# Patient Record
Sex: Female | Born: 1980 | Race: White | Hispanic: No | Smoking: Former smoker
Health system: Southern US, Community
[De-identification: ages and names within clinical notes are randomized; demographics above are authoritative.]

## PROBLEM LIST (undated history)

## (undated) ENCOUNTER — Inpatient Hospital Stay (HOSPITAL_COMMUNITY): Payer: Self-pay

## (undated) DIAGNOSIS — F419 Anxiety disorder, unspecified: Secondary | ICD-10-CM

## (undated) DIAGNOSIS — Z8742 Personal history of other diseases of the female genital tract: Secondary | ICD-10-CM

## (undated) HISTORY — PX: NO PAST SURGERIES: SHX2092

---

## 1999-11-05 ENCOUNTER — Other Ambulatory Visit: Admission: RE | Admit: 1999-11-05 | Discharge: 1999-11-05 | Payer: Self-pay | Admitting: Gynecology

## 2000-04-26 ENCOUNTER — Inpatient Hospital Stay (HOSPITAL_COMMUNITY): Admission: AD | Admit: 2000-04-26 | Discharge: 2000-04-28 | Payer: Self-pay | Admitting: *Deleted

## 2001-09-14 ENCOUNTER — Inpatient Hospital Stay (HOSPITAL_COMMUNITY): Admission: AD | Admit: 2001-09-14 | Discharge: 2001-09-14 | Payer: Self-pay | Admitting: *Deleted

## 2001-09-14 ENCOUNTER — Encounter: Payer: Self-pay | Admitting: *Deleted

## 2001-11-07 ENCOUNTER — Other Ambulatory Visit: Admission: RE | Admit: 2001-11-07 | Discharge: 2001-11-07 | Payer: Self-pay | Admitting: Gynecology

## 2001-11-07 ENCOUNTER — Other Ambulatory Visit: Admission: RE | Admit: 2001-11-07 | Discharge: 2001-11-07 | Payer: Self-pay | Admitting: Obstetrics and Gynecology

## 2002-01-31 ENCOUNTER — Other Ambulatory Visit: Admission: RE | Admit: 2002-01-31 | Discharge: 2002-01-31 | Payer: Self-pay | Admitting: Obstetrics and Gynecology

## 2002-04-22 ENCOUNTER — Inpatient Hospital Stay (HOSPITAL_COMMUNITY): Admission: AD | Admit: 2002-04-22 | Discharge: 2002-04-22 | Payer: Self-pay | Admitting: Obstetrics & Gynecology

## 2002-04-26 ENCOUNTER — Inpatient Hospital Stay (HOSPITAL_COMMUNITY): Admission: AD | Admit: 2002-04-26 | Discharge: 2002-04-28 | Payer: Self-pay | Admitting: Obstetrics and Gynecology

## 2002-05-24 ENCOUNTER — Other Ambulatory Visit: Admission: RE | Admit: 2002-05-24 | Discharge: 2002-05-24 | Payer: Self-pay | Admitting: Obstetrics and Gynecology

## 2005-12-06 ENCOUNTER — Emergency Department (HOSPITAL_COMMUNITY): Admission: EM | Admit: 2005-12-06 | Discharge: 2005-12-06 | Payer: Self-pay | Admitting: Emergency Medicine

## 2006-04-11 ENCOUNTER — Emergency Department (HOSPITAL_COMMUNITY): Admission: EM | Admit: 2006-04-11 | Discharge: 2006-04-11 | Payer: Self-pay | Admitting: Emergency Medicine

## 2007-05-02 ENCOUNTER — Inpatient Hospital Stay (HOSPITAL_COMMUNITY): Admission: AD | Admit: 2007-05-02 | Discharge: 2007-05-02 | Payer: Self-pay | Admitting: Obstetrics & Gynecology

## 2007-06-01 ENCOUNTER — Inpatient Hospital Stay (HOSPITAL_COMMUNITY): Admission: AD | Admit: 2007-06-01 | Discharge: 2007-06-01 | Payer: Self-pay | Admitting: Obstetrics and Gynecology

## 2007-06-04 ENCOUNTER — Inpatient Hospital Stay (HOSPITAL_COMMUNITY): Admission: AD | Admit: 2007-06-04 | Discharge: 2007-06-04 | Payer: Self-pay | Admitting: Obstetrics & Gynecology

## 2007-06-22 ENCOUNTER — Inpatient Hospital Stay (HOSPITAL_COMMUNITY): Admission: AD | Admit: 2007-06-22 | Discharge: 2007-06-24 | Payer: Self-pay | Admitting: Obstetrics & Gynecology

## 2009-06-07 ENCOUNTER — Encounter: Admission: RE | Admit: 2009-06-07 | Discharge: 2009-06-07 | Payer: Self-pay | Admitting: Family Medicine

## 2010-06-03 ENCOUNTER — Emergency Department (HOSPITAL_COMMUNITY): Admission: EM | Admit: 2010-06-03 | Discharge: 2010-06-03 | Payer: Self-pay | Admitting: Family Medicine

## 2010-06-05 ENCOUNTER — Emergency Department (HOSPITAL_COMMUNITY): Admission: EM | Admit: 2010-06-05 | Discharge: 2010-06-05 | Payer: Self-pay | Admitting: Family Medicine

## 2011-05-05 LAB — CBC
HCT: 30 — ABNORMAL LOW
Hemoglobin: 10.6 — ABNORMAL LOW
Hemoglobin: 12.8
MCHC: 35.1
MCHC: 35.2
MCV: 95.4
RBC: 3.84 — ABNORMAL LOW
RDW: 12.6

## 2011-05-06 ENCOUNTER — Encounter (HOSPITAL_COMMUNITY): Payer: Self-pay

## 2011-05-06 ENCOUNTER — Inpatient Hospital Stay (HOSPITAL_COMMUNITY): Payer: Self-pay

## 2011-05-06 ENCOUNTER — Inpatient Hospital Stay (HOSPITAL_COMMUNITY)
Admission: AD | Admit: 2011-05-06 | Discharge: 2011-05-06 | Disposition: A | Discharge status: Home / Self Care | Payer: Self-pay | Source: Ambulatory Visit | Attending: Obstetrics & Gynecology | Admitting: Obstetrics & Gynecology

## 2011-05-06 DIAGNOSIS — O26899 Other specified pregnancy related conditions, unspecified trimester: Secondary | ICD-10-CM

## 2011-05-06 DIAGNOSIS — R1032 Left lower quadrant pain: Secondary | ICD-10-CM | POA: Insufficient documentation

## 2011-05-06 DIAGNOSIS — B373 Candidiasis of vulva and vagina: Secondary | ICD-10-CM

## 2011-05-06 DIAGNOSIS — N760 Acute vaginitis: Secondary | ICD-10-CM | POA: Insufficient documentation

## 2011-05-06 DIAGNOSIS — B3731 Acute candidiasis of vulva and vagina: Secondary | ICD-10-CM | POA: Insufficient documentation

## 2011-05-06 DIAGNOSIS — B9689 Other specified bacterial agents as the cause of diseases classified elsewhere: Secondary | ICD-10-CM | POA: Insufficient documentation

## 2011-05-06 DIAGNOSIS — A499 Bacterial infection, unspecified: Secondary | ICD-10-CM

## 2011-05-06 DIAGNOSIS — R109 Unspecified abdominal pain: Secondary | ICD-10-CM

## 2011-05-06 HISTORY — DX: Anxiety disorder, unspecified: F41.9

## 2011-05-06 LAB — URINALYSIS, ROUTINE W REFLEX MICROSCOPIC
Bilirubin Urine: NEGATIVE
Glucose, UA: NEGATIVE mg/dL
Hgb urine dipstick: NEGATIVE
Protein, ur: NEGATIVE mg/dL
Specific Gravity, Urine: 1.02 (ref 1.005–1.030)
Urobilinogen, UA: 1 mg/dL (ref 0.0–1.0)

## 2011-05-06 LAB — CBC
HCT: 37.5 % (ref 36.0–46.0)
Hemoglobin: 13.3 g/dL (ref 12.0–15.0)
MCV: 95.2 fL (ref 78.0–100.0)
RDW: 12.2 % (ref 11.5–15.5)
WBC: 6.7 10*3/uL (ref 4.0–10.5)

## 2011-05-06 LAB — WET PREP, GENITAL: Trich, Wet Prep: NONE SEEN

## 2011-05-06 MED ORDER — METRONIDAZOLE 500 MG PO TABS: 500.0000 mg | ORAL_TABLET | Freq: Two times a day (BID) | ORAL | Status: AC

## 2011-05-06 MED ORDER — FLUCONAZOLE 150 MG PO TABS: 150.0000 mg | ORAL_TABLET | Freq: Once | ORAL | Status: AC

## 2011-05-06 NOTE — ED Provider Notes (Signed)
History   Pt presents today c/o LLQ pain that has worsened over the past couple of days. She thinks she is early preg and is worried about an ectopic preg. She also c/o vag dc but denies vag bleeding, fever, irritation, or any other problems at this time.  Chief Complaint  Patient presents with  . Abdominal Pain   HPI  OB History    Grav Para Term Preterm Abortions TAB SAB Ect Mult Living   4 3 3  0  0 0 0 0 3      Past Medical History  Diagnosis Date  . Anxiety   . Abnormal Pap smear     repeat WNL    Past Surgical History  Procedure Date  . No past surgeries     No family history on file.  History  Substance Use Topics  . Smoking status: Never Smoker   . Smokeless tobacco: Not on file  . Alcohol Use: No    Allergies: Allergies not on file  No prescriptions prior to admission    Review of Systems  Constitutional: Negative for fever.  Cardiovascular: Negative for chest pain.  Gastrointestinal: Positive for abdominal pain. Negative for nausea, vomiting, diarrhea and constipation.  Genitourinary: Negative for dysuria, urgency, frequency and hematuria.  Neurological: Negative for dizziness and headaches.  Psychiatric/Behavioral: Negative for depression and suicidal ideas.   Physical Exam   Blood pressure 102/58, pulse 83, temperature 98.7 F (37.1 C), temperature source Oral, resp. rate 20, height 5\' 4"  (1.626 m), weight 188 lb (85.276 kg), SpO2 99.00%.  Physical Exam  Nursing note and vitals reviewed. Constitutional: She is oriented to person, place, and time. She appears well-developed and well-nourished. No distress.  HENT:  Head: Normocephalic and atraumatic.  Eyes: EOM are normal. Pupils are equal, round, and reactive to light.  GI: Soft. She exhibits no distension. There is tenderness. There is no rebound and no guarding.  Genitourinary: No bleeding around the vagina. Vaginal discharge found.       Copious amount of thin, green vag dc present. Cervix  Lg/closed. Uterus nontender on exam. Tenderness to left adnexa. No obvious adnexal masses.  Neurological: She is alert and oriented to person, place, and time.  Skin: Skin is warm and dry. She is not diaphoretic.  Psychiatric: She has a normal mood and affect. Her behavior is normal. Judgment and thought content normal.    MAU Course  Procedures  Wet prep and GC/chlamydia cultures done.  Results for orders placed during the hospital encounter of 05/06/11 (from the past 24 hour(s))  URINALYSIS, ROUTINE W REFLEX MICROSCOPIC     Status: Normal   Collection Time   05/06/11  8:30 PM      Component Value Range   Color, Urine YELLOW  YELLOW    Appearance CLEAR  CLEAR    Specific Gravity, Urine 1.020  1.005 - 1.030    pH 6.5  5.0 - 8.0    Glucose, UA NEGATIVE  NEGATIVE (mg/dL)   Hgb urine dipstick NEGATIVE  NEGATIVE    Bilirubin Urine NEGATIVE  NEGATIVE    Ketones, ur NEGATIVE  NEGATIVE (mg/dL)   Protein, ur NEGATIVE  NEGATIVE (mg/dL)   Urobilinogen, UA 1.0  0.0 - 1.0 (mg/dL)   Nitrite NEGATIVE  NEGATIVE    Leukocytes, UA NEGATIVE  NEGATIVE   POCT PREGNANCY, URINE     Status: Normal   Collection Time   05/06/11  8:40 PM      Component Value Range  Preg Test, Ur POSITIVE    WET PREP, GENITAL     Status: Abnormal   Collection Time   05/06/11  8:55 PM      Component Value Range   Yeast, Wet Prep FEW (*) NONE SEEN    Trich, Wet Prep NONE SEEN  NONE SEEN    Clue Cells, Wet Prep MODERATE (*) NONE SEEN    WBC, Wet Prep HPF POC MANY (*) NONE SEEN   CBC     Status: Normal   Collection Time   05/06/11  9:05 PM      Component Value Range   WBC 6.7  4.0 - 10.5 (K/uL)   RBC 3.94  3.87 - 5.11 (MIL/uL)   Hemoglobin 13.3  12.0 - 15.0 (g/dL)   HCT 16.1  09.6 - 04.5 (%)   MCV 95.2  78.0 - 100.0 (fL)   MCH 33.8  26.0 - 34.0 (pg)   MCHC 35.5  30.0 - 36.0 (g/dL)   RDW 40.9  81.1 - 91.4 (%)   Platelets 193  150 - 400 (K/uL)   US Ob Comp Less 14 Wks  05/06/2011  *RADIOLOGY REPORT*   Clinical Data: Left lower quadrant cramping, positive urinary pregnancy test.  OBSTETRIC <14 WK ULTRASOUND  Technique:  Transabdominal ultrasound was performed for evaluation of the gestation as well as the maternal uterus and adnexal regions.  Comparison:  None.  Intrauterine gestational sac: Visualized, normal in shape. Yolk sac: Normal in appearance. Embryo: Not identified. Cardiac Activity: Not applicable.  MSD:  1.15 cm 5w  6d          [place] EDC: 12/31/2011  Maternal uterus/Adnexae: A small subchorionic hemorrhage.  Right ovary demonstrates a normal sonographic appearance.  The left ovary demonstrates a 2.1 x 1.7 x 1.7 cm simple cyst.  No free fluid.  IMPRESSION: Intrauterine gestational sac, with estimated age based on size of 5 weeks 6 days.  No embryo identified however this may be secondary to the early timing of this examination.  Recommend correlation with serial Beta HCG and ultrasound follow-up if warranted.  Original Report Authenticated By: Waneta Martins, M.D.    Assessment and Plan  Abd pain in preg: pt with IUP. Discussed diet, activity, risks, and precautions.  Yeast: will tx with diflucan.  BV: will tx with Flagyl. Warned of antabuse reaction. Discussed diet, activity, risks, and precautions.  Clinton Gallant. Rice III, DrHSc, MPAS, PA-C  05/06/2011, 9:00 PM   Henrietta Hoover, PA 05/06/11 2157

## 2011-05-06 NOTE — Progress Notes (Signed)
Pt states constant lower left quadrant cramping x3 days. States feels like she has a "fever" in her lower left abdomen. Also states that lower abdomen has "swollen" overnight. Is unsure of LMP, sometime between mid August & beginning of September.

## 2011-05-06 NOTE — Progress Notes (Signed)
Pt states that 3 days ago her left side started feeling "funny"-she took a UPT and it's positive and her left side pain has worsened

## 2011-05-07 LAB — CBC
HCT: 33.2 — ABNORMAL LOW
Hemoglobin: 11.7 — ABNORMAL LOW
MCV: 97.2
RBC: 3.42 — ABNORMAL LOW
WBC: 8.3

## 2011-05-07 LAB — DIFFERENTIAL
Basophils Absolute: 0
Basophils Relative: 0
Eosinophils Relative: 2
Lymphocytes Relative: 18
Monocytes Absolute: 0.6
Neutro Abs: 6.1

## 2011-05-07 LAB — COMPREHENSIVE METABOLIC PANEL
AST: 26
CO2: 22
Calcium: 8.1 — ABNORMAL LOW
Chloride: 105
Creatinine, Ser: 0.52
GFR calc non Af Amer: >60
Glucose, Bld: 88
Total Bilirubin: 0.4

## 2011-05-07 LAB — URINALYSIS, ROUTINE W REFLEX MICROSCOPIC
Glucose, UA: NEGATIVE
Hgb urine dipstick: NEGATIVE
Specific Gravity, Urine: 1.025
pH: 6.5

## 2011-05-07 LAB — GC/CHLAMYDIA PROBE AMP, GENITAL
Chlamydia, DNA Probe: NEGATIVE
GC Probe Amp, Genital: NEGATIVE

## 2011-05-07 LAB — URINE CULTURE: Colony Count: NO GROWTH

## 2011-07-14 ENCOUNTER — Other Ambulatory Visit: Payer: Self-pay | Admitting: Internal Medicine

## 2011-10-07 ENCOUNTER — Encounter (HOSPITAL_COMMUNITY): Payer: Self-pay | Admitting: *Deleted

## 2011-10-07 ENCOUNTER — Emergency Department (HOSPITAL_COMMUNITY)
Admission: EM | Admit: 2011-10-07 | Discharge: 2011-10-08 | Discharge status: Against Medical Advice | Payer: Self-pay | Attending: Emergency Medicine | Admitting: Emergency Medicine

## 2011-10-07 DIAGNOSIS — R1031 Right lower quadrant pain: Secondary | ICD-10-CM | POA: Insufficient documentation

## 2011-10-07 LAB — URINALYSIS, ROUTINE W REFLEX MICROSCOPIC
Bilirubin Urine: NEGATIVE
Ketones, ur: NEGATIVE mg/dL
Nitrite: NEGATIVE
Specific Gravity, Urine: 1.009 (ref 1.005–1.030)
Urobilinogen, UA: 0.2 mg/dL (ref 0.0–1.0)
pH: 8 (ref 5.0–8.0)

## 2011-10-07 LAB — POCT PREGNANCY, URINE: Preg Test, Ur: NEGATIVE

## 2011-10-07 LAB — URINE MICROSCOPIC-ADD ON

## 2011-10-07 NOTE — ED Notes (Signed)
No ans x2

## 2011-10-07 NOTE — ED Notes (Signed)
No answer x1

## 2011-10-07 NOTE — ED Notes (Signed)
No answer x3

## 2011-10-07 NOTE — ED Notes (Signed)
Pt reports sharp left upper quadrant abdominal pain radiating around rib cage with nausea. Rates pain 10/10. No sob or diaphoresis.

## 2013-05-04 ENCOUNTER — Inpatient Hospital Stay (HOSPITAL_COMMUNITY)
Admission: AD | Admit: 2013-05-04 | Discharge: 2013-05-04 | Disposition: A | Discharge status: Home / Self Care | Payer: Self-pay | Source: Ambulatory Visit | Attending: Obstetrics & Gynecology | Admitting: Obstetrics & Gynecology

## 2013-05-04 ENCOUNTER — Encounter (HOSPITAL_COMMUNITY): Payer: Self-pay | Admitting: *Deleted

## 2013-05-04 ENCOUNTER — Inpatient Hospital Stay (HOSPITAL_COMMUNITY): Payer: Medicaid Other

## 2013-05-04 DIAGNOSIS — K5289 Other specified noninfective gastroenteritis and colitis: Secondary | ICD-10-CM | POA: Insufficient documentation

## 2013-05-04 DIAGNOSIS — R1031 Right lower quadrant pain: Secondary | ICD-10-CM

## 2013-05-04 DIAGNOSIS — K529 Noninfective gastroenteritis and colitis, unspecified: Secondary | ICD-10-CM

## 2013-05-04 DIAGNOSIS — R112 Nausea with vomiting, unspecified: Secondary | ICD-10-CM | POA: Insufficient documentation

## 2013-05-04 DIAGNOSIS — R109 Unspecified abdominal pain: Secondary | ICD-10-CM | POA: Insufficient documentation

## 2013-05-04 LAB — COMPREHENSIVE METABOLIC PANEL
ALT: 10 U/L (ref 0–35)
Alkaline Phosphatase: 43 U/L (ref 39–117)
BUN: 5 mg/dL — ABNORMAL LOW (ref 6–23)
CO2: 25 mEq/L (ref 19–32)
Calcium: 9.5 mg/dL (ref 8.4–10.5)
GFR calc Af Amer: 88 mL/min — ABNORMAL LOW (ref >90)
GFR calc non Af Amer: 76 mL/min — ABNORMAL LOW (ref >90)
Glucose, Bld: 81 mg/dL (ref 70–99)
Sodium: 137 mEq/L (ref 135–145)

## 2013-05-04 LAB — URINALYSIS, ROUTINE W REFLEX MICROSCOPIC
Glucose, UA: NEGATIVE mg/dL
Ketones, ur: 15 mg/dL — AB
Leukocytes, UA: NEGATIVE
pH: 6 (ref 5.0–8.0)

## 2013-05-04 LAB — URINE MICROSCOPIC-ADD ON

## 2013-05-04 LAB — WET PREP, GENITAL: Trich, Wet Prep: NONE SEEN

## 2013-05-04 LAB — CBC
HCT: 41.1 % (ref 36.0–46.0)
Hemoglobin: 14.5 g/dL (ref 12.0–15.0)
MCH: 33 pg (ref 26.0–34.0)
MCV: 93.4 fL (ref 78.0–100.0)
RBC: 4.4 MIL/uL (ref 3.87–5.11)

## 2013-05-04 MED ORDER — PROMETHAZINE HCL 25 MG PO TABS: 25.0000 mg | ORAL_TABLET | Freq: Four times a day (QID) | ORAL | Status: DC | PRN

## 2013-05-04 MED ORDER — DEXTROSE IN LACTATED RINGERS 5 % IV SOLN
Freq: Once | INTRAVENOUS | Status: AC
  Administered 2013-05-04: 09:00:00 via INTRAVENOUS

## 2013-05-04 MED ORDER — ONDANSETRON HCL 4 MG/2ML IJ SOLN
4.0000 mg | Freq: Once | INTRAMUSCULAR | Status: AC
  Administered 2013-05-04: 4 mg via INTRAVENOUS
  Filled 2013-05-04: qty 2

## 2013-05-04 MED ORDER — SODIUM CHLORIDE 0.9 % IV SOLN
INTRAVENOUS | Status: DC
  Administered 2013-05-04: 11:00:00 via INTRAVENOUS
  Filled 2013-05-04 (×10): qty 1000

## 2013-05-04 MED ORDER — FAMOTIDINE IN NACL 20-0.9 MG/50ML-% IV SOLN
20.0000 mg | Freq: Once | INTRAVENOUS | Status: AC
  Administered 2013-05-04: 20 mg via INTRAVENOUS
  Filled 2013-05-04: qty 50

## 2013-05-04 NOTE — MAU Provider Note (Signed)
History     CSN: [id number]  Arrival date and time: 05/04/13 0806   None     Chief Complaint  Patient presents with  . Emesis  . Vaginal Bleeding  . Possible Pregnancy   HPI RN note: Registered Nurse Signed MAU Note Service date: 05/04/2013 8:12 AM   Ongoing problem with vomiting last 11 days. Period last month started when she expected but only lasted 3 days and was lighter than usual. Has done mult test.(7 total: 4 neg, 3 pos/not in that order). Stringy blood with d/c noted yesterday and this morning.     Pt denies any lower abdominal cramping or UTI sx. Pt has some upper abd cramping she thought was related to vomiting. Pt denies chills/fever.  Last bowel movement was 2 days ago; Pt last ate last night- a piece of bread.    Past Medical History  Diagnosis Date  . Anxiety   . Abnormal Pap smear     repeat WNL    Past Surgical History  Procedure Laterality Date  . No past surgeries      No family history on file.  History  Substance Use Topics  . Smoking status: Never Smoker   . Smokeless tobacco: Not on file  . Alcohol Use: No    Allergies:  Allergies  Allergen Reactions  . Vicodin [Hydrocodone-Acetaminophen] Itching    Prescriptions prior to admission  Medication Sig Dispense Refill  . acetaminophen (TYLENOL) 500 MG tablet Take 500 mg by mouth every 6 (six) hours as needed. For headaches.      . ALPRAZolam (XANAX) 1 MG tablet Take 1 mg by mouth at bedtime as needed.        . cyclobenzaprine (FLEXERIL) 10 MG tablet Take 10 mg by mouth 3 (three) times daily as needed. For pain.        Review of Systems  Constitutional: Negative for fever and chills.  Gastrointestinal: Positive for nausea, vomiting and abdominal pain. Negative for diarrhea and constipation.  Genitourinary: Negative for dysuria and urgency.   Physical Exam   Height 5\' 4"  (1.626 m), weight 82.101 kg (181 lb), last menstrual period 04/04/2013.  Physical Exam  Nursing note and vitals  reviewed. Constitutional: She is oriented to person, place, and time. She appears well-developed and well-nourished. No distress.  HENT:  Head: Normocephalic.  Eyes: Pupils are equal, round, and reactive to light.  Neck: Normal range of motion.  Cardiovascular: Normal rate.   Respiratory: Effort normal.  GI: Soft. Bowel sounds are normal. There is no tenderness. There is no rebound and no guarding.  Genitourinary:  Mod amount of frothy whtie discharge in vault; cervix clean; uterus and adnexa without palpable enlargement or tenderness  Musculoskeletal: Normal range of motion.  Neurological: She is alert and oriented to person, place, and time.  Skin: Skin is warm and dry.  Psychiatric: She has a normal mood and affect.    MAU Course  Procedures Results for orders placed during the hospital encounter of 05/04/13 (from the past 24 hour(s))  URINALYSIS, ROUTINE W REFLEX MICROSCOPIC     Status: Abnormal   Collection Time    05/04/13  8:10 AM      Result Value Range   Color, Urine YELLOW  YELLOW   APPearance CLEAR  CLEAR   Specific Gravity, Urine >1.030 (*) 1.005 - 1.030   pH 6.0  5.0 - 8.0   Glucose, UA NEGATIVE  NEGATIVE mg/dL   Hgb urine dipstick TRACE (*) NEGATIVE  Bilirubin Urine SMALL (*) NEGATIVE   Ketones, ur 15 (*) NEGATIVE mg/dL   Protein, ur NEGATIVE  NEGATIVE mg/dL   Urobilinogen, UA 1.0  0.0 - 1.0 mg/dL   Nitrite NEGATIVE  NEGATIVE   Leukocytes, UA NEGATIVE  NEGATIVE  URINE MICROSCOPIC-ADD ON     Status: Abnormal   Collection Time    05/04/13  8:10 AM      Result Value Range   Squamous Epithelial / LPF FEW (*) RARE   WBC, UA 0-2  <3 WBC/hpf   RBC / HPF 3-6  <3 RBC/hpf   Urine-Other MUCOUS PRESENT    HCG, QUANTITATIVE, PREGNANCY     Status: None   Collection Time    05/04/13  8:25 AM      Result Value Range   hCG, Beta Chain, Quant, S <1  <5 mIU/mL  CBC     Status: None   Collection Time    05/04/13  8:25 AM      Result Value Range   WBC 5.9  4.0 - 10.5  K/uL   RBC 4.40  3.87 - 5.11 MIL/uL   Hemoglobin 14.5  12.0 - 15.0 g/dL   HCT 16.1  09.6 - 04.5 %   MCV 93.4  78.0 - 100.0 fL   MCH 33.0  26.0 - 34.0 pg   MCHC 35.3  30.0 - 36.0 g/dL   RDW 40.9  81.1 - 91.4 %   Platelets 169  150 - 400 K/uL  COMPREHENSIVE METABOLIC PANEL     Status: Abnormal   Collection Time    05/04/13  8:25 AM      Result Value Range   Sodium 137  135 - 145 mEq/L   Potassium 3.7  3.5 - 5.1 mEq/L   Chloride 101  96 - 112 mEq/L   CO2 25  19 - 32 mEq/L   Glucose, Bld 81  70 - 99 mg/dL   BUN 5 (*) 6 - 23 mg/dL   Creatinine, Ser 7.82  0.50 - 1.10 mg/dL   Calcium 9.5  8.4 - 95.6 mg/dL   Total Protein 7.3  6.0 - 8.3 g/dL   Albumin 4.0  3.5 - 5.2 g/dL   AST 16  0 - 37 U/L   ALT 10  0 - 35 U/L   Alkaline Phosphatase 43  39 - 117 U/L   Total Bilirubin 0.6  0.3 - 1.2 mg/dL   GFR calc non Af Amer 76 (*) >90 mL/min   GFR calc Af Amer 88 (*) >90 mL/min  US Ob Transvaginal  05/04/2013   CLINICAL DATA:  Abdominal pain  EXAM: TRANSABDOMINAL AND TRANSVAGINAL ULTRASOUND OF PELVIS  TECHNIQUE: Both transabdominal and transvaginal ultrasound examinations of the pelvis were performed. Transabdominal technique was performed for global imaging of the pelvis including uterus, ovaries, adnexal regions, and pelvic cul-de-sac. It was necessary to proceed with endovaginal exam following the transabdominal exam to visualize the ovaries and endometrium.  COMPARISON:  Pelvic ultrasound 05/06/2011  FINDINGS: Uterus  Measurements: 8.4 x 4.8 x 5.6 cm No fibroids or other mass visualized.  Endometrium  Thickness: Measures 7.6 mm.  No focal abnormality visualized.  Right ovary  Measurements: Measures 2.6 x 1.6 x 2.3 cm. Normal appearance/no adnexal mass.  Left ovary  Measurements: Measures 2.2 x 2.5 x 2.0 cm. Normal appearance/no adnexal mass.  Other findings  No free fluid.  IMPRESSION: Grossly unremarkable uterus and bilateral ovaries.   Electronically Signed   By: Francis Gaines.D.  On: 05/04/2013  12:46   US Pelvis Complete  05/04/2013   CLINICAL DATA:  Abdominal pain  EXAM: TRANSABDOMINAL AND TRANSVAGINAL ULTRASOUND OF PELVIS  TECHNIQUE: Both transabdominal and transvaginal ultrasound examinations of the pelvis were performed. Transabdominal technique was performed for global imaging of the pelvis including uterus, ovaries, adnexal regions, and pelvic cul-de-sac. It was necessary to proceed with endovaginal exam following the transabdominal exam to visualize the ovaries and endometrium.  COMPARISON:  Pelvic ultrasound 05/06/2011  FINDINGS: Uterus  Measurements: 8.4 x 4.8 x 5.6 cm No fibroids or other mass visualized.  Endometrium  Thickness: Measures 7.6 mm.  No focal abnormality visualized.  Right ovary  Measurements: Measures 2.6 x 1.6 x 2.3 cm. Normal appearance/no adnexal mass.  Left ovary  Measurements: Measures 2.2 x 2.5 x 2.0 cm. Normal appearance/no adnexal mass.  Other findings  No free fluid.  IMPRESSION: Grossly unremarkable uterus and bilateral ovaries.   Electronically Signed   By: Annia Belt M.D.   On: 05/04/2013 12:46   US Abdomen Limited Ruq  05/04/2013   CLINICAL DATA:  Abdominal pain, nausea, vomiting for 1 day  EXAM: US ABDOMEN LIMITED - RIGHT UPPER QUADRANT  COMPARISON:  None  FINDINGS: Gallbladder  Small amount debris or sludge within gallbladder. No shadowing calculi, gallbladder wall thickening or pericholecystic fluid. No sonographic Murphy sign.  Common bile duct  Diameter: 3 mm diameter, normal  Liver:  Normal appearance.  No right upper quadrant ascites.  IMPRESSION: Minimal debris or sludge within gallbladder without evidence of cholelithiasis or cholecystitis.  Otherwise negative exam.   Electronically Signed   By: Ulyses Southward M.D.   On: 05/04/2013 12:36  IV hydration with D5LR and LR and antiemetics Zofran 4mg  IVP and Pepcid 20mg  and Phenergan 25mg IV Pt felt better and tolerating PO fluids Reviewed ultrasound results reviewed with pt  Assessment and Plan  Nausea and  vomiting/gastroenteritis Phenergan 25mg  tabs B>R>A>T diet F/u with PCP   LINEBERRY,SUSAN 05/04/2013, 8:17 AM

## 2013-05-04 NOTE — MAU Note (Signed)
Ongoing problem with vomiting last 11 days. Period last month started when she expected but only lasted 3 days and was lighter than usual.  Has done mult test.(7 total: 4 neg, 3 pos/not in that order). Stringy blood with d/c noted yesterday and this morning.

## 2013-05-05 LAB — GC/CHLAMYDIA PROBE AMP: CT Probe RNA: NEGATIVE

## 2014-02-05 ENCOUNTER — Inpatient Hospital Stay (HOSPITAL_COMMUNITY)
Admission: AD | Admit: 2014-02-05 | Discharge: 2014-02-05 | Disposition: A | Discharge status: Home / Self Care | Payer: Medicaid Other | Source: Ambulatory Visit | Attending: Family Medicine | Admitting: Family Medicine

## 2014-02-05 ENCOUNTER — Encounter (HOSPITAL_COMMUNITY): Payer: Self-pay | Admitting: *Deleted

## 2014-02-05 ENCOUNTER — Inpatient Hospital Stay (HOSPITAL_COMMUNITY): Payer: Medicaid Other

## 2014-02-05 DIAGNOSIS — N7011 Chronic salpingitis: Secondary | ICD-10-CM

## 2014-02-05 DIAGNOSIS — O26891 Other specified pregnancy related conditions, first trimester: Secondary | ICD-10-CM

## 2014-02-05 DIAGNOSIS — O9989 Other specified diseases and conditions complicating pregnancy, childbirth and the puerperium: Secondary | ICD-10-CM

## 2014-02-05 DIAGNOSIS — R109 Unspecified abdominal pain: Secondary | ICD-10-CM | POA: Diagnosis present

## 2014-02-05 DIAGNOSIS — N949 Unspecified condition associated with female genital organs and menstrual cycle: Secondary | ICD-10-CM

## 2014-02-05 DIAGNOSIS — O2390 Unspecified genitourinary tract infection in pregnancy, unspecified trimester: Secondary | ICD-10-CM | POA: Diagnosis not present

## 2014-02-05 DIAGNOSIS — N7013 Chronic salpingitis and oophoritis: Secondary | ICD-10-CM | POA: Insufficient documentation

## 2014-02-05 DIAGNOSIS — R102 Pelvic and perineal pain: Secondary | ICD-10-CM

## 2014-02-05 LAB — URINE MICROSCOPIC-ADD ON

## 2014-02-05 LAB — CBC
HCT: 35 % — ABNORMAL LOW (ref 36.0–46.0)
Hemoglobin: 12.6 g/dL (ref 12.0–15.0)
MCH: 34 pg (ref 26.0–34.0)
MCHC: 36 g/dL (ref 30.0–36.0)
MCV: 94.3 fL (ref 78.0–100.0)
PLATELETS: 168 10*3/uL (ref 150–400)
RBC: 3.71 MIL/uL — ABNORMAL LOW (ref 3.87–5.11)
RDW: 12.3 % (ref 11.5–15.5)
WBC: 6.6 10*3/uL (ref 4.0–10.5)

## 2014-02-05 LAB — URINALYSIS, ROUTINE W REFLEX MICROSCOPIC
Bilirubin Urine: NEGATIVE
GLUCOSE, UA: NEGATIVE mg/dL
Hgb urine dipstick: NEGATIVE
KETONES UR: 40 mg/dL — AB
NITRITE: NEGATIVE
PH: 6.5 (ref 5.0–8.0)
Protein, ur: NEGATIVE mg/dL
SPECIFIC GRAVITY, URINE: 1.02 (ref 1.005–1.030)
Urobilinogen, UA: 2 mg/dL — ABNORMAL HIGH (ref 0.0–1.0)

## 2014-02-05 LAB — HCG, QUANTITATIVE, PREGNANCY: hCG, Beta Chain, Quant, S: 23357 m[IU]/mL — ABNORMAL HIGH (ref <5)

## 2014-02-05 LAB — WET PREP, GENITAL
Trich, Wet Prep: NONE SEEN
Yeast Wet Prep HPF POC: NONE SEEN

## 2014-02-05 LAB — POCT PREGNANCY, URINE: Preg Test, Ur: POSITIVE — AB

## 2014-02-05 MED ORDER — METRONIDAZOLE 500 MG PO TABS: 500.0000 mg | ORAL_TABLET | Freq: Two times a day (BID) | ORAL | Status: DC

## 2014-02-05 MED ORDER — AZITHROMYCIN 250 MG PO TABS: ORAL_TABLET | ORAL | Status: DC

## 2014-02-05 NOTE — Discharge Instructions (Signed)

## 2014-02-05 NOTE — MAU Provider Note (Signed)
History     CSN: [id number]  Arrival date and time: 02/05/14 1901   First Provider Initiated Contact with Patient 02/05/14 2028      Chief Complaint  Patient presents with  . Possible Pregnancy  . Abdominal Pain  . Back Pain   Possible Pregnancy Associated symptoms include abdominal pain.  Abdominal Pain  Back Pain Associated symptoms include abdominal pain.    Sarah Kemp is a 33 y.o. [phone] at 654w1d who presents today with lower abdominal pain and back pain x 1 week. She states that she took a pregnancy test this morning, and it was positive. She denies any bleeding.   Past Medical History  Diagnosis Date  . Anxiety   . Abnormal Pap smear     repeat WNL    Past Surgical History  Procedure Laterality Date  . No past surgeries      History reviewed. No pertinent family history.  History  Substance Use Topics  . Smoking status: Never Smoker   . Smokeless tobacco: Not on file  . Alcohol Use: No    Allergies:  Allergies  Allergen Reactions  . Vicodin [Hydrocodone-Acetaminophen] Itching    Prescriptions prior to admission  Medication Sig Dispense Refill  . ibuprofen (ADVIL,MOTRIN) 200 MG tablet Take 400 mg by mouth as needed for headache, moderate pain or cramping.        Review of Systems  Gastrointestinal: Positive for abdominal pain.  Musculoskeletal: Positive for back pain.   Physical Exam   Blood pressure 112/63, pulse 87, temperature 98.6 F (37 C), temperature source Oral, resp. rate 16, height 5\' 4"  (1.626 m), weight 83.008 kg (183 lb), last menstrual period 12/24/2013, SpO2 100.00%.  Physical Exam  Nursing note and vitals reviewed. Constitutional: She is oriented to person, place, and time. She appears well-developed and well-nourished. No distress.  Cardiovascular: Normal rate.   Respiratory: Effort normal.  GI: Soft. There is no tenderness. There is no rebound.  Genitourinary:   External: no lesion Vagina: small amount of white  discharge Cervix: pink, smooth, no CMT Uterus: NSSC Adnexa: left slightly tender, right non-tender    Neurological: She is alert and oriented to person, place, and time.  Skin: Skin is warm and dry.  Psychiatric: She has a normal mood and affect.    MAU Course  Procedures  Results for orders placed during the hospital encounter of 02/05/14 (from the past 24 hour(s))  URINALYSIS, ROUTINE W REFLEX MICROSCOPIC     Status: Abnormal   Collection Time    02/05/14  7:47 PM      Result Value Ref Range   Color, Urine YELLOW  YELLOW   APPearance CLEAR  CLEAR   Specific Gravity, Urine 1.020  1.005 - 1.030   pH 6.5  5.0 - 8.0   Glucose, UA NEGATIVE  NEGATIVE mg/dL   Hgb urine dipstick NEGATIVE  NEGATIVE   Bilirubin Urine NEGATIVE  NEGATIVE   Ketones, ur 40 (*) NEGATIVE mg/dL   Protein, ur NEGATIVE  NEGATIVE mg/dL   Urobilinogen, UA 2.0 (*) 0.0 - 1.0 mg/dL   Nitrite NEGATIVE  NEGATIVE   Leukocytes, UA SMALL (*) NEGATIVE  URINE MICROSCOPIC-ADD ON     Status: Abnormal   Collection Time    02/05/14  7:47 PM      Result Value Ref Range   Squamous Epithelial / LPF FEW (*) RARE   WBC, UA 3-6  <3 WBC/hpf   Bacteria, UA FEW (*) RARE   Urine-Other MUCOUS PRESENT  POCT PREGNANCY, URINE     Status: Abnormal   Collection Time    02/05/14  8:05 PM      Result Value Ref Range   Preg Test, Ur POSITIVE (*) NEGATIVE  ABO/RH     Status: None   Collection Time    02/05/14  8:16 PM      Result Value Ref Range   ABO/RH(D) B POS    CBC     Status: Abnormal   Collection Time    02/05/14  8:17 PM      Result Value Ref Range   WBC 6.6  4.0 - 10.5 K/uL   RBC 3.71 (*) 3.87 - 5.11 MIL/uL   Hemoglobin 12.6  12.0 - 15.0 g/dL   HCT 03.4 (*) 74.2 - 59.5 %   MCV 94.3  78.0 - 100.0 fL   MCH 34.0  26.0 - 34.0 pg   MCHC 36.0  30.0 - 36.0 g/dL   RDW 63.8  75.6 - 43.3 %   Platelets 168  150 - 400 K/uL  HCG, QUANTITATIVE, PREGNANCY     Status: Abnormal   Collection Time    02/05/14  8:18 PM      Result  Value Ref Range   hCG, Beta Chain, Quant, [place] [address] (*) <5 mIU/mL  WET PREP, GENITAL     Status: Abnormal   Collection Time    02/05/14  8:30 PM      Result Value Ref Range   Yeast Wet Prep HPF POC NONE SEEN  NONE SEEN   Trich, Wet Prep NONE SEEN  NONE SEEN   Clue Cells Wet Prep HPF POC FEW (*) NONE SEEN   WBC, Wet Prep HPF POC MANY (*) NONE SEEN   US Ob Comp Less 14 Wks  02/05/2014   CLINICAL DATA:  Pelvic pain  EXAM: OBSTETRIC <14 WK [place] AND TRANSVAGINAL OB US  TECHNIQUE: Both transabdominal and transvaginal ultrasound examinations were performed for complete evaluation of the gestation as well as the maternal uterus, adnexal regions, and pelvic cul-de-sac. Transvaginal technique was performed to assess early pregnancy.  COMPARISON:  None.  FINDINGS: Intrauterine gestational sac: Visualized/normal in shape.  Yolk sac:  Visualized  Embryo:  Visualized  Cardiac Activity: Visualized  Heart Rate:  129 bpm  CRL:   5  mm   6 w 3 d                  [place] EDC: September 28, 2014  Maternal uterus/adnexae: There is a small area of subchorionic hemorrhage measuring 2.1 x 1.2 cm. Cervical os is closed.  Both maternal ovaries appear normal. There is, however, apparent dilatation of the right fallopian tube. There is no free pelvic fluid.  IMPRESSION: Single live intrauterine gestation with estimated gestational age of 6+ weeks. Small subchorionic hemorrhage. Dilatation the right fallopian tube. Question a degree of hydrosalpinx. These findings warrant close clinical and imaging surveillance.   Electronically Signed   By: Bretta Bang M.D.   On: 02/05/2014 21:12   US Ob Transvaginal  02/05/2014   CLINICAL DATA:  Pelvic pain  EXAM: OBSTETRIC <14 WK [place] AND TRANSVAGINAL OB US  TECHNIQUE: Both transabdominal and transvaginal ultrasound examinations were performed for complete evaluation of the gestation as well as the maternal uterus, adnexal regions, and pelvic cul-de-sac. Transvaginal technique was performed to assess  early pregnancy.  COMPARISON:  None.  FINDINGS: Intrauterine gestational sac: Visualized/normal in shape.  Yolk sac:  Visualized  Embryo:  Visualized  Cardiac Activity: Visualized  Heart Rate:  129 bpm  CRL:   5  mm   6 w 3 d                  [place] EDC: September 28, 2014  Maternal uterus/adnexae: There is a small area of subchorionic hemorrhage measuring 2.1 x 1.2 cm. Cervical os is closed.  Both maternal ovaries appear normal. There is, however, apparent dilatation of the right fallopian tube. There is no free pelvic fluid.  IMPRESSION: Single live intrauterine gestation with estimated gestational age of 6+ weeks. Small subchorionic hemorrhage. Dilatation the right fallopian tube. Question a degree of hydrosalpinx. These findings warrant close clinical and imaging surveillance.   Electronically Signed   By: Bretta Bang M.D.   On: 02/05/2014 21:12   2124: D/W Dr. Shawnie Pons, will treat with 5 days of azithromycin, and 1 week of flagyl.   Assessment and Plan   1. Pelvic pain in pregnancy, antepartum, first trimester   2. Hydrosalpinx    First trimester precautions Return to MAU as needed   Medication List    STOP taking these medications       ibuprofen 200 MG tablet  Commonly known as:  ADVIL,MOTRIN      TAKE these medications       azithromycin 250 MG tablet  Commonly known as:  ZITHROMAX Z-PAK  2 tabs PO on day one, and then 1 tab each day for four days     metroNIDAZOLE 500 MG tablet  Commonly known as:  FLAGYL  Take 1 tablet (500 mg total) by mouth 2 (two) times daily.       Follow-up Information   Schedule an appointment as soon as possible for a visit with [facility]   Contact information:   [address] [place] [address] [phone]       Tawnya Crook 02/05/2014, 8:36 PM

## 2014-02-05 NOTE — MAU Note (Signed)
Pt reports lower abd and lower back pain for the last 3 days, LMP 12/24/2013 positive home preg test.

## 2014-02-06 LAB — ABO/RH: ABO/RH(D): B POS

## 2014-02-06 LAB — GC/CHLAMYDIA PROBE AMP
CT PROBE, AMP APTIMA: NEGATIVE
GC PROBE AMP APTIMA: NEGATIVE

## 2014-02-07 NOTE — MAU Provider Note (Signed)
Attestation of Attending Supervision of Advanced Practitioner (PA/CNM/NP): Evaluation and management procedures were performed by the Advanced Practitioner under my supervision and collaboration.  I have reviewed the Advanced Practitioner's note and chart, and I agree with the management and plan.  Reva BoresPRATT,Kalmen Lollar S, MD [facility] Attending 02/07/2014 10:15 AM

## 2014-02-12 ENCOUNTER — Encounter (HOSPITAL_COMMUNITY): Payer: Self-pay | Admitting: *Deleted

## 2014-02-12 ENCOUNTER — Inpatient Hospital Stay (HOSPITAL_COMMUNITY)
Admission: AD | Admit: 2014-02-12 | Discharge: 2014-02-12 | Disposition: A | Discharge status: Home / Self Care | Payer: Medicaid Other | Source: Ambulatory Visit | Attending: Obstetrics & Gynecology | Admitting: Obstetrics & Gynecology

## 2014-02-12 DIAGNOSIS — R142 Eructation: Secondary | ICD-10-CM

## 2014-02-12 DIAGNOSIS — R143 Flatulence: Secondary | ICD-10-CM

## 2014-02-12 DIAGNOSIS — O9989 Other specified diseases and conditions complicating pregnancy, childbirth and the puerperium: Principal | ICD-10-CM

## 2014-02-12 DIAGNOSIS — R109 Unspecified abdominal pain: Secondary | ICD-10-CM | POA: Diagnosis not present

## 2014-02-12 DIAGNOSIS — O99891 Other specified diseases and conditions complicating pregnancy: Secondary | ICD-10-CM | POA: Insufficient documentation

## 2014-02-12 DIAGNOSIS — R140 Abdominal distension (gaseous): Secondary | ICD-10-CM

## 2014-02-12 DIAGNOSIS — R141 Gas pain: Secondary | ICD-10-CM | POA: Diagnosis present

## 2014-02-12 LAB — URINALYSIS, ROUTINE W REFLEX MICROSCOPIC
BILIRUBIN URINE: NEGATIVE
GLUCOSE, UA: NEGATIVE mg/dL
HGB URINE DIPSTICK: NEGATIVE
Ketones, ur: NEGATIVE mg/dL
Nitrite: NEGATIVE
Protein, ur: NEGATIVE mg/dL
SPECIFIC GRAVITY, URINE: 1.025 (ref 1.005–1.030)
UROBILINOGEN UA: 0.2 mg/dL (ref 0.0–1.0)
pH: 5.5 (ref 5.0–8.0)

## 2014-02-12 LAB — URINE MICROSCOPIC-ADD ON

## 2014-02-12 MED ORDER — PROMETHAZINE HCL 25 MG/ML IJ SOLN: 12.5000 mg | Freq: Once | INTRAMUSCULAR | Status: DC

## 2014-02-12 MED ORDER — SIMETHICONE 80 MG PO CHEW: 160.0000 mg | CHEWABLE_TABLET | Freq: Four times a day (QID) | ORAL | Status: DC | PRN

## 2014-02-12 MED ORDER — SIMETHICONE 80 MG PO CHEW
160.0000 mg | CHEWABLE_TABLET | Freq: Once | ORAL | Status: AC
  Administered 2014-02-12: 160 mg via ORAL
  Filled 2014-02-12: qty 2

## 2014-02-12 MED ORDER — PROMETHAZINE HCL 25 MG PO TABS: 12.5000 mg | ORAL_TABLET | Freq: Four times a day (QID) | ORAL | Status: DC | PRN

## 2014-02-12 NOTE — Discharge Instructions (Signed)

## 2014-02-12 NOTE — MAU Provider Note (Signed)
Attestation of Attending Supervision of Advanced Practitioner (CNM/NP): Evaluation and management procedures were performed by the Advanced Practitioner under my supervision and collaboration.  I have reviewed the Advanced Practitioner's note and chart, and I agree with the management and plan.  HARRAWAY-SMITH, Emogene Muratalla 11:40 PM   \

## 2014-02-12 NOTE — MAU Note (Signed)
Pt c/o abdominal pain that feels like contractions since Friday. Noticed that her stomach has gotten larger and thinks she's further along that 7weeks. Denies vaginal bleeding, but feels like she has some watery discharge last night. Pt not wearing a pad. Also has some SOB. O2 SAT in triage 100%.

## 2014-02-12 NOTE — MAU Provider Note (Signed)
History     CSN: [id number]  Arrival date and time: 02/12/14 4782   First Provider Initiated Contact with Patient 02/12/14 2028      No chief complaint on file.  HPI  Sarah Kemp is a 33 y.o. [phone] at [redacted]w[redacted]d who presents today with bloating. She states that she feels like she is much farther along than 7 weeks because her abdomen has grown so fast. She states that it is large, and she has been having a lot of gas and cramping along the upper left side of the abdomen. She had a [place] with documented IUP with cardiac activity on 02/05/14. She denies any bleeding today.   Past Medical History  Diagnosis Date  . Anxiety   . Abnormal Pap smear     repeat WNL    Past Surgical History  Procedure Laterality Date  . No past surgeries      History reviewed. No pertinent family history.  History  Substance Use Topics  . Smoking status: Never Smoker   . Smokeless tobacco: Not on file  . Alcohol Use: No    Allergies:  Allergies  Allergen Reactions  . Vicodin [Hydrocodone-Acetaminophen] Itching    Prescriptions prior to admission  Medication Sig Dispense Refill  . azithromycin (ZITHROMAX Z-PAK) 250 MG tablet 2 tabs PO on day one, and then 1 tab each day for four days  6 each  0  . metroNIDAZOLE (FLAGYL) 500 MG tablet Take 1 tablet (500 mg total) by mouth 2 (two) times daily.  14 tablet  0    ROS Physical Exam   Blood pressure 122/74, pulse 82, temperature 98.6 F (37 C), temperature source Oral, resp. rate 18, height 5' 3.6" (1.615 m), weight 84.823 kg (187 lb), last menstrual period 12/24/2013, SpO2 100.00%.  Physical Exam  Nursing note and vitals reviewed. Constitutional: She is oriented to person, place, and time. She appears well-developed and well-nourished. No distress.  Cardiovascular: Normal rate.   Respiratory: Effort normal.  GI: Soft. Bowel sounds are normal. She exhibits distension. She exhibits no mass. There is no tenderness. There is no rebound and no  guarding.  Neurological: She is alert and oriented to person, place, and time.  Skin: Skin is warm and dry.  Psychiatric: She has a normal mood and affect.    MAU Course  Procedures  Results for orders placed during the hospital encounter of 02/12/14 (from the past 24 hour(s))  URINALYSIS, ROUTINE W REFLEX MICROSCOPIC     Status: Abnormal   Collection Time    02/12/14  7:49 PM      Result Value Ref Range   Color, Urine YELLOW  YELLOW   APPearance CLEAR  CLEAR   Specific Gravity, Urine 1.025  1.005 - 1.030   pH 5.5  5.0 - 8.0   Glucose, UA NEGATIVE  NEGATIVE mg/dL   Hgb urine dipstick NEGATIVE  NEGATIVE   Bilirubin Urine NEGATIVE  NEGATIVE   Ketones, ur NEGATIVE  NEGATIVE mg/dL   Protein, ur NEGATIVE  NEGATIVE mg/dL   Urobilinogen, UA 0.2  0.0 - 1.0 mg/dL   Nitrite NEGATIVE  NEGATIVE   Leukocytes, UA SMALL (*) NEGATIVE  URINE MICROSCOPIC-ADD ON     Status: Abnormal   Collection Time    02/12/14  7:49 PM      Result Value Ref Range   Squamous Epithelial / LPF FEW (*) RARE   WBC, UA 3-6  <3 WBC/hpf   RBC / HPF 0-2  <3 RBC/hpf  Bacteria, UA RARE  RARE   Urine-Other MUCOUS PRESENT       Assessment and Plan   1. Bloating   2. Flatulence      Medication List         azithromycin 250 MG tablet  Commonly known as:  ZITHROMAX Z-PAK  2 tabs PO on day one, and then 1 tab each day for four days     metroNIDAZOLE 500 MG tablet  Commonly known as:  FLAGYL  Take 1 tablet (500 mg total) by mouth 2 (two) times daily.     promethazine 25 MG tablet  Commonly known as:  PHENERGAN  Take 0.5 tablets (12.5 mg total) by mouth every 6 (six) hours as needed for nausea or vomiting.     simethicone 80 MG chewable tablet  Commonly known as:  GAS-X  Chew 2 tablets (160 mg total) by mouth every 6 (six) hours as needed for flatulence.       Follow-up Information   Schedule an appointment as soon as possible for a visit with [facility] (As  scheduled)    Contact information:   [address] [place] [phone] [phone]       Tawnya CrookHogan, Heather Donovan 02/12/2014, 8:33 PM

## 2014-03-26 LAB — OB RESULTS CONSOLE RUBELLA ANTIBODY, IGM: Rubella: IMMUNE

## 2014-03-26 LAB — OB RESULTS CONSOLE GC/CHLAMYDIA
CHLAMYDIA, DNA PROBE: NEGATIVE
GC PROBE AMP, GENITAL: NEGATIVE

## 2014-03-26 LAB — OB RESULTS CONSOLE HEPATITIS B SURFACE ANTIGEN: Hepatitis B Surface Ag: NEGATIVE

## 2014-03-26 LAB — OB RESULTS CONSOLE ANTIBODY SCREEN: Antibody Screen: NEGATIVE

## 2014-03-26 LAB — OB RESULTS CONSOLE RPR: RPR: NONREACTIVE

## 2014-03-26 LAB — OB RESULTS CONSOLE HIV ANTIBODY (ROUTINE TESTING): HIV: NONREACTIVE

## 2014-05-28 ENCOUNTER — Encounter (HOSPITAL_COMMUNITY): Payer: Self-pay | Admitting: *Deleted

## 2014-07-27 NOTE — L&D Delivery Note (Signed)
Pt presented to L&D with SROM. She rapidly completed the first stage with difficulty. She had an epidural. She pushed one time and had a SVD of one live viable white infant over an intact perineum. Nuchal cord x 1. Placenta-S/I. Baby to NBN. EBL-400cc

## 2014-08-12 ENCOUNTER — Inpatient Hospital Stay (HOSPITAL_COMMUNITY)
Admission: AD | Admit: 2014-08-12 | Discharge: 2014-08-12 | Disposition: A | Discharge status: Home / Self Care | Payer: Medicaid Other | Source: Ambulatory Visit | Attending: Obstetrics and Gynecology | Admitting: Obstetrics and Gynecology

## 2014-08-12 ENCOUNTER — Encounter (HOSPITAL_COMMUNITY): Payer: Self-pay | Admitting: *Deleted

## 2014-08-12 DIAGNOSIS — O1203 Gestational edema, third trimester: Secondary | ICD-10-CM | POA: Diagnosis not present

## 2014-08-12 DIAGNOSIS — R609 Edema, unspecified: Secondary | ICD-10-CM

## 2014-08-12 DIAGNOSIS — M7989 Other specified soft tissue disorders: Secondary | ICD-10-CM | POA: Diagnosis present

## 2014-08-12 DIAGNOSIS — Z3A33 33 weeks gestation of pregnancy: Secondary | ICD-10-CM | POA: Insufficient documentation

## 2014-08-12 LAB — URINALYSIS, ROUTINE W REFLEX MICROSCOPIC
BILIRUBIN URINE: NEGATIVE
Glucose, UA: NEGATIVE mg/dL
Hgb urine dipstick: NEGATIVE
KETONES UR: NEGATIVE mg/dL
Nitrite: NEGATIVE
Protein, ur: NEGATIVE mg/dL
SPECIFIC GRAVITY, URINE: 1.015 (ref 1.005–1.030)
Urobilinogen, UA: 0.2 mg/dL (ref 0.0–1.0)
pH: 6.5 (ref 5.0–8.0)

## 2014-08-12 LAB — URINE MICROSCOPIC-ADD ON

## 2014-08-12 NOTE — MAU Provider Note (Signed)
History     CSN: [id number]  Arrival date and time: 08/12/14 1123   First Provider Initiated Contact with Patient 08/12/14 1212      Chief Complaint  Patient presents with  . Headache  . hands swelling    HPI Sarah Kemp 34 y.o. 6331w0d  Comes to MAU with swollen hands. Has problems with her hands and arms being numb.  Is having difficulty at work.   Had a bad headache yesterday which was not relieved with Tylenol but did resolve overnight.  No headache today.  No swollen ankles today but occasionally ankles are swollen.  OB History    Gravida Para Term Preterm AB TAB SAB Ectopic Multiple Living   4 3 3  0  0 0 0 0 3      Past Medical History  Diagnosis Date  . Anxiety   . Abnormal Pap smear     repeat WNL    Past Surgical History  Procedure Laterality Date  . No past surgeries      History reviewed. No pertinent family history.  History  Substance Use Topics  . Smoking status: Never Smoker   . Smokeless tobacco: Not on file  . Alcohol Use: No    Allergies:  Allergies  Allergen Reactions  . Vicodin [Hydrocodone-Acetaminophen] Itching    Prescriptions prior to admission  Medication Sig Dispense Refill Last Dose  . acetaminophen (TYLENOL) 500 MG tablet Take 1,000 mg by mouth every 6 (six) hours as needed for mild pain.   08/11/2014 at Unknown time  . Prenatal Vit-Fe Fumarate-FA (PRENATAL MULTIVITAMIN) TABS tablet Take 1 tablet by mouth daily at 12 noon.   Past Month at Unknown time  . azithromycin (ZITHROMAX Z-PAK) 250 MG tablet 2 tabs PO on day one, and then 1 tab each day for four days (Patient not taking: Reported on 08/12/2014) 6 each 0 02/12/2014 at Unknown time  . metroNIDAZOLE (FLAGYL) 500 MG tablet Take 1 tablet (500 mg total) by mouth 2 (two) times daily. (Patient not taking: Reported on 08/12/2014) 14 tablet 0 02/12/2014 at Unknown time  . promethazine (PHENERGAN) 25 MG tablet Take 0.5 tablets (12.5 mg total) by mouth every 6 (six) hours as needed for  nausea or vomiting. (Patient not taking: Reported on 08/12/2014) 30 tablet 1   . simethicone (GAS-X) 80 MG chewable tablet Chew 2 tablets (160 mg total) by mouth every 6 (six) hours as needed for flatulence. (Patient not taking: Reported on 08/12/2014) 30 tablet 1     Review of Systems  Constitutional: Negative for fever.  Gastrointestinal: Negative for nausea, vomiting and abdominal pain.  Genitourinary: Negative for dysuria.       No vaginal bleeding or leaking.  Musculoskeletal:       Edema in hands and sometimes in feet  Neurological: Positive for headaches.       Numbness in hands and lower arms bilaterally   Physical Exam   Blood pressure 117/71, pulse 83, temperature 98 F (36.7 C), resp. rate 18, height 5\' 5"  (1.651 m), weight 220 lb (99.791 kg), last menstrual period 12/24/2013.  Physical Exam  Nursing note and vitals reviewed. Constitutional: She is oriented to person, place, and time. She appears well-developed and well-nourished.  HENT:  Head: Normocephalic.  Eyes: EOM are normal.  Neck: Neck supple.  GI: Soft. There is no tenderness. There is no rebound and no guarding.  Musculoskeletal: Normal range of motion. She exhibits edema.  Edema of hands, none in ankles  Neurological: She  is alert and oriented to person, place, and time.  Skin: Skin is warm and dry.  Psychiatric: She has a normal mood and affect.    MAU Course  Procedures Results for orders placed or performed during the hospital encounter of 08/12/14 (from the past 24 hour(s))  Urinalysis, Routine w reflex microscopic     Status: Abnormal   Collection Time: 08/12/14 11:30 AM  Result Value Ref Range   Color, Urine YELLOW YELLOW   APPearance CLEAR CLEAR   Specific Gravity, Urine 1.015 1.005 - 1.030   pH 6.5 5.0 - 8.0   Glucose, UA NEGATIVE NEGATIVE mg/dL   Hgb urine dipstick NEGATIVE NEGATIVE   Bilirubin Urine NEGATIVE NEGATIVE   Ketones, ur NEGATIVE NEGATIVE mg/dL   Protein, ur NEGATIVE NEGATIVE  mg/dL   Urobilinogen, UA 0.2 0.0 - 1.0 mg/dL   Nitrite NEGATIVE NEGATIVE   Leukocytes, UA SMALL (A) NEGATIVE  Urine microscopic-add on     Status: Abnormal   Collection Time: 08/12/14 11:30 AM  Result Value Ref Range   Squamous Epithelial / LPF FEW (A) RARE   WBC, UA 0-2 <3 WBC/hpf   RBC / HPF 0-2 <3 RBC/hpf   Bacteria, UA FEW (A) RARE    MDM Consult with Dr. Henderson Cloud - Reviewed plan of care, no headache today, no edema in ankles, serial blood pressures are normal  Assessment and Plan  Edema of hands  Plan Use wrist splints for numbness in hands Be seen in the office if you need a note to be out of work. Limit salt intake and intake of foods that worsen edema - sodas, canned foods, deli meats, etc.   BURLESON,TERRI 08/12/2014, 12:26 PM

## 2014-08-12 NOTE — MAU Note (Signed)
Pt presents to MAU with complaints of a headache and swelling in her hands since Friday. Pt spoke with nurse at the office  on Friday and was told to come in and be evaluated then.

## 2014-08-12 NOTE — Discharge Instructions (Signed)
You can try wrist splints to help with the numbness.  If you are not improving and need to have a work note, make an appointment in the office for evaluation. Drink at least 8 8-oz glasses of water every day. Do not eat salt or heavily salted foods.

## 2014-09-04 LAB — OB RESULTS CONSOLE GBS: STREP GROUP B AG: POSITIVE

## 2014-09-18 ENCOUNTER — Encounter (HOSPITAL_COMMUNITY): Payer: Self-pay | Admitting: *Deleted

## 2014-09-18 ENCOUNTER — Inpatient Hospital Stay (HOSPITAL_COMMUNITY)
Admission: AD | Admit: 2014-09-18 | Discharge: 2014-09-18 | Disposition: A | Discharge status: Home / Self Care | Payer: Medicaid Other | Source: Ambulatory Visit | Attending: Obstetrics and Gynecology | Admitting: Obstetrics and Gynecology

## 2014-09-18 DIAGNOSIS — O9989 Other specified diseases and conditions complicating pregnancy, childbirth and the puerperium: Secondary | ICD-10-CM | POA: Insufficient documentation

## 2014-09-18 DIAGNOSIS — R1011 Right upper quadrant pain: Secondary | ICD-10-CM | POA: Insufficient documentation

## 2014-09-18 DIAGNOSIS — Z3A38 38 weeks gestation of pregnancy: Secondary | ICD-10-CM | POA: Diagnosis not present

## 2014-09-18 LAB — COMPREHENSIVE METABOLIC PANEL
ALT: 12 U/L (ref 0–35)
AST: 22 U/L (ref 0–37)
Albumin: 2.6 g/dL — ABNORMAL LOW (ref 3.5–5.2)
Alkaline Phosphatase: 192 U/L — ABNORMAL HIGH (ref 39–117)
Anion gap: 6 (ref 5–15)
BUN: 5 mg/dL — ABNORMAL LOW (ref 6–23)
CO2: 21 mmol/L (ref 19–32)
Calcium: 8.4 mg/dL (ref 8.4–10.5)
Chloride: 107 mmol/L (ref 96–112)
Creatinine, Ser: 0.77 mg/dL (ref 0.50–1.10)
GFR calc Af Amer: >90 mL/min (ref >90)
GFR calc non Af Amer: >90 mL/min (ref >90)
Glucose, Bld: 84 mg/dL (ref 70–99)
Potassium: 4 mmol/L (ref 3.5–5.1)
Sodium: 134 mmol/L — ABNORMAL LOW (ref 135–145)
Total Bilirubin: 0.4 mg/dL (ref 0.3–1.2)
Total Protein: 6.1 g/dL (ref 6.0–8.3)

## 2014-09-18 LAB — CBC WITH DIFFERENTIAL/PLATELET
BASOS PCT: 0 % (ref 0–1)
Basophils Absolute: 0 10*3/uL (ref 0.0–0.1)
Eosinophils Absolute: 0.1 10*3/uL (ref 0.0–0.7)
Eosinophils Relative: 1 % (ref 0–5)
HCT: 30.3 % — ABNORMAL LOW (ref 36.0–46.0)
Hemoglobin: 10.1 g/dL — ABNORMAL LOW (ref 12.0–15.0)
Lymphocytes Relative: 17 % (ref 12–46)
Lymphs Abs: 1.1 10*3/uL (ref 0.7–4.0)
MCH: 29.4 pg (ref 26.0–34.0)
MCHC: 33.3 g/dL (ref 30.0–36.0)
MCV: 88.3 fL (ref 78.0–100.0)
MONO ABS: 0.5 10*3/uL (ref 0.1–1.0)
Monocytes Relative: 8 % (ref 3–12)
Neutro Abs: 4.9 10*3/uL (ref 1.7–7.7)
Neutrophils Relative %: 74 % (ref 43–77)
Platelets: 159 10*3/uL (ref 150–400)
RBC: 3.43 MIL/uL — AB (ref 3.87–5.11)
RDW: 12.9 % (ref 11.5–15.5)
WBC: 6.6 10*3/uL (ref 4.0–10.5)

## 2014-09-18 LAB — URINALYSIS, ROUTINE W REFLEX MICROSCOPIC
Bilirubin Urine: NEGATIVE
GLUCOSE, UA: NEGATIVE mg/dL
Hgb urine dipstick: NEGATIVE
Ketones, ur: NEGATIVE mg/dL
Nitrite: NEGATIVE
PH: 6 (ref 5.0–8.0)
Protein, ur: NEGATIVE mg/dL
Urobilinogen, UA: 0.2 mg/dL (ref 0.0–1.0)

## 2014-09-18 LAB — URINE MICROSCOPIC-ADD ON

## 2014-09-18 LAB — AMYLASE: Amylase: 77 U/L (ref 0–105)

## 2014-09-18 LAB — LIPASE, BLOOD: Lipase: 48 U/L (ref 11–59)

## 2014-09-18 NOTE — MAU Note (Signed)
Pt states she has been feelinig pain in her right flank area. Pain started about 3 days ago, pt states" I thought she   was stuck under a rib" but pain discomfort has not gone away after movement of baby

## 2014-09-18 NOTE — MAU Note (Signed)
Pt reports she has had pain in her right upper abd off/on for the last 3 days, states it is not constant but when it comes it is severe. Denies headache or blurred vision. Denies nausea, vomiting or fever.

## 2014-09-18 NOTE — Discharge Instructions (Signed)
Cholecystitis ° Cholecystitis is swelling and irritation (inflammation) of your gallbladder. This often happens when gallstones or sludge build up in the gallbladder. Treatment is needed right away. °HOME CARE °Home care depends on how you were treated. In general: °· If you were given antibiotic medicine, take it as told. Finish the medicine even if you start to feel better. °· Only take medicines as told by your doctor. °· Eat low-fat foods until your next doctor visit. °· Keep all doctor visits as told. °GET HELP RIGHT AWAY IF: °· You have more pain and medicine does not help. °· Your pain moves to a different part of your belly (abdomen) or to your back. °· You have a fever. °· You feel sick to your stomach (nauseous). °· You throw up (vomit). °MAKE SURE YOU: °· Understand these instructions. °· Will watch your condition. °· Will get help right away if you are not doing well or get worse. °Document Released: 07/02/2011 Document Revised: 10/05/2011 Document Reviewed: 07/02/2011 °Exit[employer]® Patient Information ©2015 [employer]. This information is not intended to replace advice given to you by your health care provider. Make sure you discuss any questions you have with your health care provider. ° °

## 2014-09-18 NOTE — MAU Provider Note (Signed)
History     CSN: [id number]  Arrival date and time: 09/18/14 [id number]   First Provider Initiated Contact with Patient 09/18/14 2049      Chief Complaint  Patient presents with  . Abdominal Pain   HPI  [name] is a 34 y.o. [phone] at 1319w2d who presents to MAU today with complaint of intermittent RUQ pain x 3 days. She was seen in the office on Friday and had labs done. She states no pain right now, but pain can be 8/10 at worst. She has had nausea since Friday but denies vomiting. She denies fever, vaginal bleeding, abnormal discharge, LOF, headache, blurred vision or change in peripheral edema.   OB History    Gravida Para Term Preterm AB TAB SAB Ectopic Multiple Living   4 3 3  0  0 0 0 0 3      Past Medical History  Diagnosis Date  . Anxiety   . Abnormal Pap smear     repeat WNL    Past Surgical History  Procedure Laterality Date  . No past surgeries      History reviewed. No pertinent family history.  History  Substance Use Topics  . Smoking status: Never Smoker   . Smokeless tobacco: Not on file  . Alcohol Use: No    Allergies:  Allergies  Allergen Reactions  . Vicodin [Hydrocodone-Acetaminophen] Itching    No prescriptions prior to admission    Review of Systems  Constitutional: Negative for fever and malaise/fatigue.  Eyes: Negative for blurred vision.  Cardiovascular: Negative for leg swelling.  Gastrointestinal: Positive for nausea and abdominal pain. Negative for vomiting.  Genitourinary: Negative for dysuria, urgency and frequency.       Neg - vaginal bleeding, discharge, LOF  Neurological: Negative for headaches.   Physical Exam   Blood pressure 144/87, pulse 88, temperature 98.3 F (36.8 C), temperature source Oral, resp. rate 18, height 5' 4.57" (1.64 m), weight 234 lb (106.142 kg), last menstrual period 12/24/2013, SpO2 99 %.  Physical Exam  Constitutional: She is oriented to person, place, and time. She appears well-developed  and well-nourished. No distress.  HENT:  Head: Normocephalic.  Cardiovascular: Normal rate.   Respiratory: Effort normal.  GI: Soft. She exhibits no distension and no mass. There is tenderness (point tenderness in the RUQ). There is no rebound and no guarding.  Musculoskeletal: She exhibits edema (very mild non-pitting edema).  Neurological: She is alert and oriented to person, place, and time.  No clonus  Skin: Skin is warm and dry. No erythema.  Psychiatric: She has a normal mood and affect.  Cervix: 3/70/-3  Results for orders placed or performed during the hospital encounter of 09/18/14 (from the past 24 hour(s))  Urinalysis, Routine w reflex microscopic     Status: Abnormal   Collection Time: 09/18/14  7:39 PM  Result Value Ref Range   Color, Urine YELLOW YELLOW   APPearance CLEAR CLEAR   Specific Gravity, Urine <1.005 (L) 1.005 - 1.030   pH 6.0 5.0 - 8.0   Glucose, UA NEGATIVE NEGATIVE mg/dL   Hgb urine dipstick NEGATIVE NEGATIVE   Bilirubin Urine NEGATIVE NEGATIVE   Ketones, ur NEGATIVE NEGATIVE mg/dL   Protein, ur NEGATIVE NEGATIVE mg/dL   Urobilinogen, UA 0.2 0.0 - 1.0 mg/dL   Nitrite NEGATIVE NEGATIVE   Leukocytes, UA SMALL (A) NEGATIVE  Urine microscopic-add on     Status: Abnormal   Collection Time: 09/18/14  7:39 PM  Result Value Ref Range  Squamous Epithelial / LPF RARE RARE   WBC, UA 3-6 <3 WBC/hpf   RBC / HPF 0-2 <3 RBC/hpf   Bacteria, UA FEW (A) RARE  CBC with Differential/Platelet     Status: Abnormal   Collection Time: 09/18/14  9:12 PM  Result Value Ref Range   WBC 6.6 4.0 - 10.5 K/uL   RBC 3.43 (L) 3.87 - 5.11 MIL/uL   Hemoglobin 10.1 (L) 12.0 - 15.0 g/dL   HCT 16.1 (L) 09.6 - 04.5 %   MCV 88.3 78.0 - 100.0 fL   MCH 29.4 26.0 - 34.0 pg   MCHC 33.3 30.0 - 36.0 g/dL   RDW 40.9 81.1 - 91.4 %   Platelets 159 150 - 400 K/uL   Neutrophils Relative % 74 43 - 77 %   Neutro Abs 4.9 1.7 - 7.7 K/uL   Lymphocytes Relative 17 12 - 46 %   Lymphs Abs 1.1 0.7  - 4.0 K/uL   Monocytes Relative 8 3 - 12 %   Monocytes Absolute 0.5 0.1 - 1.0 K/uL   Eosinophils Relative 1 0 - 5 %   Eosinophils Absolute 0.1 0.0 - 0.7 K/uL   Basophils Relative 0 0 - 1 %   Basophils Absolute 0.0 0.0 - 0.1 K/uL  Comprehensive metabolic panel     Status: Abnormal   Collection Time: 09/18/14  9:12 PM  Result Value Ref Range   Sodium 134 (L) 135 - 145 mmol/L   Potassium 4.0 3.5 - 5.1 mmol/L   Chloride 107 96 - 112 mmol/L   CO2 21 19 - 32 mmol/L   Glucose, Bld 84 70 - 99 mg/dL   BUN 5 (L) 6 - 23 mg/dL   Creatinine, Ser 7.82 0.50 - 1.10 mg/dL   Calcium 8.4 8.4 - 95.6 mg/dL   Total Protein 6.1 6.0 - 8.3 g/dL   Albumin 2.6 (L) 3.5 - 5.2 g/dL   AST 22 0 - 37 U/L   ALT 12 0 - 35 U/L   Alkaline Phosphatase 192 (H) 39 - 117 U/L   Total Bilirubin 0.4 0.3 - 1.2 mg/dL   GFR calc non Af Amer >90 >90 mL/min   GFR calc Af Amer >90 >90 mL/min   Anion gap 6 5 - 15  Lipase, blood     Status: None   Collection Time: 09/18/14  9:12 PM  Result Value Ref Range   Lipase 48 11 - 59 U/L  Amylase     Status: None   Collection Time: 09/18/14  9:12 PM  Result Value Ref Range   Amylase 77 0 - 105 U/L   Fetal Monitoring: Baseline: 130 bmp, moderate variability, + accelerations, no decelerations Contractions: mild UI, eventually some contractions  MAU Course  Procedures None  MDM Dicussed with Dr. Henderson Cloud. If CMP does not show elevated liver enzymes she can be discharged to follow-up in the morning for RUQ [place].  RUQ [place] ordered for tomorrow morning. Patient advised to remain NPO and call us if she has not heard from them by 0900 Cervix unchanged from last visit Patient was normotensive on arrival in MAU and in the office recently, per patient. Slightly elevated BP at time of discharge appears secondary to pain.  Assessment and Plan  A: SIUP at [redacted]w[redacted]d RUQ abdominal pain  P: Discharge home Outpatient [place] ordered for tomorrow morning. Patient advised to call for appointment time  after 0800.  Warning signs for worsening condition discussed Labor precautions discussed Patient advised to follow-up with  Dr. Henderson Cloud as scheduled for routine prenatal care or sooner PRN Patient may return to MAU as needed or if her condition were to change or worsen   Marny Lowenstein, PA-C  09/19/2014, 12:01 AM

## 2014-09-19 ENCOUNTER — Telehealth: Payer: Self-pay | Admitting: Obstetrics and Gynecology

## 2014-09-19 ENCOUNTER — Ambulatory Visit (HOSPITAL_COMMUNITY)
Admission: RE | Admit: 2014-09-19 | Discharge: 2014-09-19 | Disposition: A | Discharge status: Home / Self Care | Payer: Medicaid Other | Source: Ambulatory Visit | Attending: Medical | Admitting: Medical

## 2014-09-19 DIAGNOSIS — R1011 Right upper quadrant pain: Secondary | ICD-10-CM | POA: Insufficient documentation

## 2014-09-19 DIAGNOSIS — R748 Abnormal levels of other serum enzymes: Secondary | ICD-10-CM | POA: Insufficient documentation

## 2014-09-19 DIAGNOSIS — Z3A38 38 weeks gestation of pregnancy: Secondary | ICD-10-CM | POA: Insufficient documentation

## 2014-09-19 DIAGNOSIS — O9989 Other specified diseases and conditions complicating pregnancy, childbirth and the puerperium: Secondary | ICD-10-CM | POA: Diagnosis not present

## 2014-09-19 NOTE — Telephone Encounter (Signed)
Discussed US findings with Dr. Chestine Sporelark; Dr. Chestine Sporelark ok with NP calling patient to discuss results.  Discussed US findings with the patient. The patient is encouraged to follow up with OB Dr.

## 2014-09-20 ENCOUNTER — Inpatient Hospital Stay (HOSPITAL_COMMUNITY)
Admission: AD | Admit: 2014-09-20 | Discharge: 2014-09-22 | DRG: 775 | Disposition: A | Discharge status: Home / Self Care | Payer: Medicaid Other | Source: Ambulatory Visit | Attending: Obstetrics and Gynecology | Admitting: Obstetrics and Gynecology

## 2014-09-20 ENCOUNTER — Inpatient Hospital Stay (HOSPITAL_COMMUNITY): Payer: Medicaid Other | Admitting: Anesthesiology

## 2014-09-20 ENCOUNTER — Encounter (HOSPITAL_COMMUNITY): Payer: Self-pay | Admitting: *Deleted

## 2014-09-20 DIAGNOSIS — O99214 Obesity complicating childbirth: Secondary | ICD-10-CM | POA: Diagnosis present

## 2014-09-20 DIAGNOSIS — Z6839 Body mass index (BMI) 39.0-39.9, adult: Secondary | ICD-10-CM

## 2014-09-20 DIAGNOSIS — Z3A38 38 weeks gestation of pregnancy: Secondary | ICD-10-CM | POA: Diagnosis present

## 2014-09-20 DIAGNOSIS — Z3480 Encounter for supervision of other normal pregnancy, unspecified trimester: Secondary | ICD-10-CM

## 2014-09-20 DIAGNOSIS — O09523 Supervision of elderly multigravida, third trimester: Secondary | ICD-10-CM | POA: Diagnosis not present

## 2014-09-20 DIAGNOSIS — Z3483 Encounter for supervision of other normal pregnancy, third trimester: Secondary | ICD-10-CM | POA: Diagnosis present

## 2014-09-20 DIAGNOSIS — O4290 Premature rupture of membranes, unspecified as to length of time between rupture and onset of labor, unspecified weeks of gestation: Secondary | ICD-10-CM | POA: Diagnosis present

## 2014-09-20 LAB — CBC
HEMATOCRIT: 30.3 % — AB (ref 36.0–46.0)
Hemoglobin: 10.2 g/dL — ABNORMAL LOW (ref 12.0–15.0)
MCH: 29.5 pg (ref 26.0–34.0)
MCHC: 33.7 g/dL (ref 30.0–36.0)
MCV: 87.6 fL (ref 78.0–100.0)
PLATELETS: 184 10*3/uL (ref 150–400)
RBC: 3.46 MIL/uL — ABNORMAL LOW (ref 3.87–5.11)
RDW: 13 % (ref 11.5–15.5)
WBC: 8 10*3/uL (ref 4.0–10.5)

## 2014-09-20 LAB — TYPE AND SCREEN
ABO/RH(D): B POS
ANTIBODY SCREEN: NEGATIVE

## 2014-09-20 LAB — POCT FERN TEST: POCT FERN TEST: POSITIVE

## 2014-09-20 MED ORDER — DIPHENHYDRAMINE HCL 50 MG/ML IJ SOLN: 12.5000 mg | INTRAMUSCULAR | Status: DC | PRN

## 2014-09-20 MED ORDER — OXYCODONE-ACETAMINOPHEN 5-325 MG PO TABS
1.0000 | ORAL_TABLET | ORAL | Status: DC | PRN
  Administered 2014-09-20 – 2014-09-21 (×3): 1 via ORAL
  Filled 2014-09-20 (×3): qty 1

## 2014-09-20 MED ORDER — TERBUTALINE SULFATE 1 MG/ML IJ SOLN
0.2500 mg | Freq: Once | INTRAMUSCULAR | Status: DC | PRN
Start: 2014-09-20 — End: 2014-09-20
  Filled 2014-09-20: qty 1

## 2014-09-20 MED ORDER — LACTATED RINGERS IV SOLN: 500.0000 mL | Freq: Once | INTRAVENOUS | Status: DC

## 2014-09-20 MED ORDER — FLEET ENEMA 7-19 GM/118ML RE ENEM: 1.0000 | ENEMA | RECTAL | Status: DC | PRN

## 2014-09-20 MED ORDER — LACTATED RINGERS IV SOLN: 500.0000 mL | INTRAVENOUS | Status: DC | PRN

## 2014-09-20 MED ORDER — WITCH HAZEL-GLYCERIN EX PADS: 1.0000 "application " | MEDICATED_PAD | CUTANEOUS | Status: DC | PRN

## 2014-09-20 MED ORDER — OXYCODONE-ACETAMINOPHEN 5-325 MG PO TABS: 2.0000 | ORAL_TABLET | ORAL | Status: DC | PRN

## 2014-09-20 MED ORDER — SIMETHICONE 80 MG PO CHEW
80.0000 mg | CHEWABLE_TABLET | ORAL | Status: DC | PRN
  Administered 2014-09-21: 80 mg via ORAL
  Filled 2014-09-20: qty 1

## 2014-09-20 MED ORDER — ZOLPIDEM TARTRATE 5 MG PO TABS: 5.0000 mg | ORAL_TABLET | Freq: Every evening | ORAL | Status: DC | PRN

## 2014-09-20 MED ORDER — MEASLES, MUMPS & RUBELLA VAC ~~LOC~~ INJ
0.5000 mL | INJECTION | Freq: Once | SUBCUTANEOUS | Status: DC
Start: 2014-09-21 — End: 2014-09-22

## 2014-09-20 MED ORDER — ONDANSETRON HCL 4 MG/2ML IJ SOLN: 4.0000 mg | INTRAMUSCULAR | Status: DC | PRN

## 2014-09-20 MED ORDER — SENNOSIDES-DOCUSATE SODIUM 8.6-50 MG PO TABS
2.0000 | ORAL_TABLET | ORAL | Status: DC
  Administered 2014-09-20 – 2014-09-22 (×2): 2 via ORAL
  Filled 2014-09-20 (×2): qty 2

## 2014-09-20 MED ORDER — ONDANSETRON HCL 4 MG PO TABS: 4.0000 mg | ORAL_TABLET | ORAL | Status: DC | PRN

## 2014-09-20 MED ORDER — FENTANYL 2.5 MCG/ML BUPIVACAINE 1/10 % EPIDURAL INFUSION (WH - ANES)
14.0000 mL/h | INTRAMUSCULAR | Status: DC | PRN
  Filled 2014-09-20: qty 125

## 2014-09-20 MED ORDER — PENICILLIN G POTASSIUM 5000000 UNITS IJ SOLR
2.5000 10*6.[IU] | INTRAVENOUS | Status: DC
  Filled 2014-09-20 (×3): qty 2.5

## 2014-09-20 MED ORDER — OXYTOCIN BOLUS FROM INFUSION
500.0000 mL | INTRAVENOUS | Status: DC
  Administered 2014-09-20: 500 mL via INTRAVENOUS

## 2014-09-20 MED ORDER — EPHEDRINE 5 MG/ML INJ
10.0000 mg | INTRAVENOUS | Status: DC | PRN
  Filled 2014-09-20: qty 2

## 2014-09-20 MED ORDER — OXYCODONE-ACETAMINOPHEN 5-325 MG PO TABS: 1.0000 | ORAL_TABLET | ORAL | Status: DC | PRN

## 2014-09-20 MED ORDER — ONDANSETRON HCL 4 MG/2ML IJ SOLN: 4.0000 mg | Freq: Four times a day (QID) | INTRAMUSCULAR | Status: DC | PRN

## 2014-09-20 MED ORDER — INFLUENZA VAC SPLIT QUAD 0.5 ML IM SUSY
0.5000 mL | PREFILLED_SYRINGE | INTRAMUSCULAR | Status: AC | PRN
  Administered 2014-09-20: 0.5 mL via INTRAMUSCULAR

## 2014-09-20 MED ORDER — LACTATED RINGERS IV SOLN
INTRAVENOUS | Status: DC
  Administered 2014-09-20: 15:00:00 via INTRAVENOUS

## 2014-09-20 MED ORDER — PHENYLEPHRINE 40 MCG/ML (10ML) SYRINGE FOR IV PUSH (FOR BLOOD PRESSURE SUPPORT)
80.0000 ug | PREFILLED_SYRINGE | INTRAVENOUS | Status: DC | PRN
  Filled 2014-09-20: qty 2

## 2014-09-20 MED ORDER — ACETAMINOPHEN 325 MG PO TABS: 650.0000 mg | ORAL_TABLET | ORAL | Status: DC | PRN

## 2014-09-20 MED ORDER — CITRIC ACID-SODIUM CITRATE 334-500 MG/5ML PO SOLN: 30.0000 mL | ORAL | Status: DC | PRN

## 2014-09-20 MED ORDER — PHENYLEPHRINE 40 MCG/ML (10ML) SYRINGE FOR IV PUSH (FOR BLOOD PRESSURE SUPPORT)
80.0000 ug | PREFILLED_SYRINGE | INTRAVENOUS | Status: DC | PRN
  Filled 2014-09-20: qty 20
  Filled 2014-09-20: qty 2

## 2014-09-20 MED ORDER — LIDOCAINE HCL (PF) 1 % IJ SOLN
30.0000 mL | INTRAMUSCULAR | Status: DC | PRN
  Filled 2014-09-20: qty 30

## 2014-09-20 MED ORDER — FENTANYL 2.5 MCG/ML BUPIVACAINE 1/10 % EPIDURAL INFUSION (WH - ANES)
14.0000 mL/h | INTRAMUSCULAR | Status: DC | PRN
  Administered 2014-09-20: 14 mL/h via EPIDURAL

## 2014-09-20 MED ORDER — TETANUS-DIPHTH-ACELL PERTUSSIS 5-2.5-18.5 LF-MCG/0.5 IM SUSP
0.5000 mL | Freq: Once | INTRAMUSCULAR | Status: AC
  Administered 2014-09-22: 0.5 mL via INTRAMUSCULAR
  Filled 2014-09-20: qty 0.5

## 2014-09-20 MED ORDER — OXYTOCIN 40 UNITS IN LACTATED RINGERS INFUSION - SIMPLE MED: 62.5000 mL/h | INTRAVENOUS | Status: DC

## 2014-09-20 MED ORDER — PENICILLIN G POTASSIUM 5000000 UNITS IJ SOLR
5.0000 10*6.[IU] | Freq: Once | INTRAVENOUS | Status: AC
  Administered 2014-09-20: 5 10*6.[IU] via INTRAVENOUS
  Filled 2014-09-20: qty 5

## 2014-09-20 MED ORDER — BENZOCAINE-MENTHOL 20-0.5 % EX AERO: 1.0000 "application " | INHALATION_SPRAY | CUTANEOUS | Status: DC | PRN

## 2014-09-20 MED ORDER — IBUPROFEN 600 MG PO TABS
600.0000 mg | ORAL_TABLET | Freq: Four times a day (QID) | ORAL | Status: DC
  Administered 2014-09-20 – 2014-09-22 (×8): 600 mg via ORAL
  Filled 2014-09-20 (×8): qty 1

## 2014-09-20 MED ORDER — OXYTOCIN 40 UNITS IN LACTATED RINGERS INFUSION - SIMPLE MED
1.0000 m[IU]/min | INTRAVENOUS | Status: DC
  Filled 2014-09-20: qty 1000

## 2014-09-20 MED ORDER — DIBUCAINE 1 % RE OINT: 1.0000 "application " | TOPICAL_OINTMENT | RECTAL | Status: DC | PRN

## 2014-09-20 MED ORDER — CALCIUM CARBONATE ANTACID 500 MG PO CHEW
2.0000 | CHEWABLE_TABLET | Freq: Once | ORAL | Status: AC
  Administered 2014-09-20: 400 mg via ORAL
  Filled 2014-09-20: qty 1

## 2014-09-20 MED ORDER — LIDOCAINE HCL (PF) 1 % IJ SOLN
INTRAMUSCULAR | Status: DC | PRN
  Administered 2014-09-20 (×2): 5 mL

## 2014-09-20 MED ORDER — OXYTOCIN 40 UNITS IN LACTATED RINGERS INFUSION - SIMPLE MED
1.0000 m[IU]/min | INTRAVENOUS | Status: DC
  Administered 2014-09-20: 2 m[IU]/min via INTRAVENOUS

## 2014-09-20 NOTE — H&P (Signed)
Pt presents to L&D c/o SROM. She had +pool,+ fern on admission. She had an uncomplicated PNC. PMHX: See PNR PE: VSSAF        HEENT-wnl        ABD-gravid, non tender        FHTs- reactive IMP/ IUP at term with SROM PLAN/ admit

## 2014-09-20 NOTE — Anesthesia Procedure Notes (Signed)
Epidural Patient location during procedure: OB Start time: 09/20/2014 6:28 PM  Staffing Anesthesiologist: Brayton CavesJACKSON, Mariaisabel Bodiford Performed by: anesthesiologist   Preanesthetic Checklist Completed: patient identified, site marked, surgical consent, pre-op evaluation, timeout performed, IV checked, risks and benefits discussed and monitors and equipment checked  Epidural Patient position: sitting Prep: site prepped and draped and DuraPrep Patient monitoring: continuous pulse ox and blood pressure Approach: midline Location: L3-L4 Injection technique: LOR air  Needle:  Needle type: Tuohy  Needle gauge: 17 G Needle length: 9 cm and 9 Needle insertion depth: 8 cm Catheter type: closed end flexible Catheter size: 19 Gauge Catheter at skin depth: 13 cm Test dose: negative  Assessment Events: blood not aspirated, injection not painful, no injection resistance, negative IV test and no paresthesia  Additional Notes Patient identified.  Risk benefits discussed including failed block, incomplete pain control, headache, nerve damage, paralysis, blood pressure changes, nausea, vomiting, reactions to medication both toxic or allergic, and postpartum back pain.  Patient expressed understanding and wished to proceed.  All questions were answered.  Sterile technique used throughout procedure and epidural site dressed with sterile barrier dressing. No paresthesia or other complications noted.The patient did not experience any signs of intravascular injection such as tinnitus or metallic taste in mouth nor signs of intrathecal spread such as rapid motor block. Please see nursing notes for vital signs.

## 2014-09-20 NOTE — MAU Note (Signed)
Pt to restroom, pad soaked, clothes soaked.  SROM about 1400, has been contracting all morning. No bleeding.

## 2014-09-20 NOTE — Anesthesia Preprocedure Evaluation (Signed)
Anesthesia Evaluation  Patient identified by MRN, date of birth, ID band Patient awake    Reviewed: Allergy & Precautions, H&P , Patient's Chart, lab work & pertinent test results  Airway Mallampati: II TM Distance: >3 FB Neck ROM: full    Dental   Pulmonary  breath sounds clear to auscultation        Cardiovascular Rhythm:regular Rate:Normal     Neuro/Psych    GI/Hepatic   Endo/Other  Morbid obesity  Renal/GU      Musculoskeletal   Abdominal   Peds  Hematology   Anesthesia Other Findings   Reproductive/Obstetrics (+) Pregnancy                           Anesthesia Physical Anesthesia Plan  ASA: III  Anesthesia Plan: Epidural   Post-op Pain Management:    Induction:   Airway Management Planned:   Additional Equipment:   Intra-op Plan:   Post-operative Plan:   Informed Consent: I have reviewed the patients History and Physical, chart, labs and discussed the procedure including the risks, benefits and alternatives for the proposed anesthesia with the patient or authorized representative who has indicated his/her understanding and acceptance.     Plan Discussed with:   Anesthesia Plan Comments:         Anesthesia Quick Evaluation  

## 2014-09-21 LAB — RPR: RPR Ser Ql: NONREACTIVE

## 2014-09-21 MED ORDER — CALCIUM CARBONATE ANTACID 500 MG PO CHEW
1.0000 | CHEWABLE_TABLET | ORAL | Status: DC | PRN
  Administered 2014-09-21 (×2): 200 mg via ORAL
  Filled 2014-09-21 (×2): qty 1

## 2014-09-21 MED ORDER — TRAMADOL HCL 50 MG PO TABS
50.0000 mg | ORAL_TABLET | Freq: Four times a day (QID) | ORAL | Status: DC
  Administered 2014-09-22 (×4): 50 mg via ORAL
  Filled 2014-09-21 (×5): qty 1

## 2014-09-21 NOTE — Anesthesia Postprocedure Evaluation (Signed)
  Anesthesia Post-op Note  Patient: Sarah Kemp  Procedure(s) Performed: * No procedures listed *  Patient Location: Mother/Baby  Anesthesia Type:Epidural  Level of Consciousness: awake  Airway and Oxygen Therapy: Patient Spontanous Breathing  Post-op Pain: none  Post-op Assessment: Post-op Vital signs reviewed, Patient's Cardiovascular Status Stable, Respiratory Function Stable, Patent Airway, No signs of Nausea or vomiting, Adequate PO intake, Pain level controlled, No headache, No backache, No residual numbness and No residual motor weakness  Post-op Vital Signs: Reviewed and stable  Last Vitals:  Filed Vitals:   09/21/14 0300  BP: 128/74  Pulse: 64  Temp: 36.5 C  Resp: 18    Complications: No apparent anesthesia complications

## 2014-09-21 NOTE — Progress Notes (Signed)
UR chart review completed.  

## 2014-09-21 NOTE — Progress Notes (Addendum)
Patient requested alternative pain medicine to percocet for cramping.  Dr. Henderson CloudHorvath notified at 1955. New order, 50 mg Ultram q6.   Went in to give ultram; patient said she had just taken 1,000 mg of generic tylenol on her own at 2045.  Called pharmacy--keep medication schedule as ordered.  Called Dr Henderson CloudHorvath and left message.

## 2014-09-21 NOTE — Discharge Summary (Signed)
Obstetric Discharge Summary Reason for Admission: onset of labor Prenatal Procedures: none Intrapartum Procedures: spontaneous vaginal delivery Postpartum Procedures: none Complications-Operative and Postpartum: none HEMOGLOBIN  Date Value Ref Range Status  09/20/2014 10.2* 12.0 - 15.0 g/dL Final   HCT  Date Value Ref Range Status  09/20/2014 30.3* 36.0 - 46.0 % Final     Discharge Diagnoses: Term Pregnancy-delivered  Discharge Information: Date: 09/21/2014 Activity: pelvic rest Diet: routine Medications: Ibuprofen Condition: stable Instructions: refer to practice specific booklet Discharge to: home Follow-up Information    Follow up with Levi AlandANDERSON,MARK E, MD In 4 weeks.   Specialty:  Obstetrics and Gynecology   Contact information:   [address] [place] [id number] [phone]       Newborn Data: Live born female  Birth Weight: 7 lb 12.5 oz (3530 g) APGAR: 9, 9  Home with mother.  Sarah Kemp A 09/21/2014, 7:40 AM

## 2014-09-21 NOTE — Progress Notes (Signed)
Patient is eating, ambulating, voiding.  Pain control is good.  Filed Vitals:   09/20/14 2030 09/20/14 2059 09/20/14 2202 09/21/14 0300  BP: 131/79 130/71 120/67 128/74  Pulse: 80 81 81 64  Temp:  97.7 F (36.5 C) 98.6 F (37 C) 97.7 F (36.5 C)  TempSrc:      Resp: 18 20  18   Height:      Weight:      SpO2:        Fundus firm Perineum without swelling.  Lab Results  Component Value Date   WBC 8.0 09/20/2014   HGB 10.2* 09/20/2014   HCT 30.3* 09/20/2014   MCV 87.6 09/20/2014   PLT 184 09/20/2014    --/--/B POS (02/25 1500)/RI  A/P Post partum day 1.  Routine care.  Expect d/c routine.    Arbutus Nelligan A

## 2014-09-22 MED ORDER — TRAMADOL HCL 50 MG PO TABS: 50.0000 mg | ORAL_TABLET | Freq: Four times a day (QID) | ORAL | Status: AC

## 2014-09-22 NOTE — Progress Notes (Signed)
Patient is eating, ambulating, voiding.  Pain control is good.  Filed Vitals:   09/21/14 0300 09/21/14 1108 09/21/14 1830 09/22/14 0540  BP: 128/74 136/83 130/80 109/91  Pulse: 64 73 69   Temp: 97.7 F (36.5 C) 97.6 F (36.4 C) 98.1 F (36.7 C) 97.5 F (36.4 C)  TempSrc:  Axillary Oral   Resp: 18 16 18 20   Height:      Weight:      SpO2:  98%      Fundus firm Perineum without swelling.  Lab Results  Component Value Date   WBC 8.0 09/20/2014   HGB 10.2* 09/20/2014   HCT 30.3* 09/20/2014   MCV 87.6 09/20/2014   PLT 184 09/20/2014    --/--/B POS (02/25 1500)/RI  A/P Post partum day 2.  Routine care.  Expect d/c today.    Kemyah Buser A

## 2014-09-23 ENCOUNTER — Inpatient Hospital Stay (HOSPITAL_COMMUNITY): Admission: RE | Admit: 2014-09-23 | Payer: Medicaid Other | Source: Ambulatory Visit

## 2014-10-22 ENCOUNTER — Other Ambulatory Visit: Payer: Self-pay | Admitting: Obstetrics and Gynecology

## 2014-11-02 ENCOUNTER — Encounter (HOSPITAL_BASED_OUTPATIENT_CLINIC_OR_DEPARTMENT_OTHER): Payer: Self-pay | Admitting: *Deleted

## 2014-11-02 NOTE — Progress Notes (Signed)
NPO AFTER MN.. ARRIVE AT 1030.  NEEDS CBC AND URINE PREG.  

## 2014-11-07 ENCOUNTER — Encounter (HOSPITAL_BASED_OUTPATIENT_CLINIC_OR_DEPARTMENT_OTHER): Payer: Self-pay | Admitting: *Deleted

## 2014-11-07 ENCOUNTER — Encounter (HOSPITAL_BASED_OUTPATIENT_CLINIC_OR_DEPARTMENT_OTHER)
Admission: RE | Disposition: A | Discharge status: Home / Self Care | Payer: Self-pay | Source: Ambulatory Visit | Attending: Obstetrics and Gynecology

## 2014-11-07 ENCOUNTER — Ambulatory Visit (HOSPITAL_BASED_OUTPATIENT_CLINIC_OR_DEPARTMENT_OTHER)
Admission: RE | Admit: 2014-11-07 | Discharge: 2014-11-07 | Disposition: A | Discharge status: Home / Self Care | Payer: Medicaid Other | Source: Ambulatory Visit | Attending: Obstetrics and Gynecology | Admitting: Obstetrics and Gynecology

## 2014-11-07 ENCOUNTER — Ambulatory Visit (HOSPITAL_BASED_OUTPATIENT_CLINIC_OR_DEPARTMENT_OTHER): Payer: Medicaid Other | Admitting: Certified Registered"

## 2014-11-07 DIAGNOSIS — F419 Anxiety disorder, unspecified: Secondary | ICD-10-CM | POA: Diagnosis not present

## 2014-11-07 DIAGNOSIS — Z302 Encounter for sterilization: Secondary | ICD-10-CM | POA: Diagnosis not present

## 2014-11-07 DIAGNOSIS — Z87891 Personal history of nicotine dependence: Secondary | ICD-10-CM | POA: Insufficient documentation

## 2014-11-07 DIAGNOSIS — Z885 Allergy status to narcotic agent status: Secondary | ICD-10-CM | POA: Diagnosis not present

## 2014-11-07 HISTORY — DX: Personal history of other diseases of the female genital tract: Z87.42

## 2014-11-07 HISTORY — PX: LAPAROSCOPIC TUBAL LIGATION: SHX1937

## 2014-11-07 LAB — CBC
HCT: 38.6 % (ref 36.0–46.0)
HEMOGLOBIN: 12.5 g/dL (ref 12.0–15.0)
MCH: 28.9 pg (ref 26.0–34.0)
MCHC: 32.4 g/dL (ref 30.0–36.0)
MCV: 89.4 fL (ref 78.0–100.0)
Platelets: 213 10*3/uL (ref 150–400)
RBC: 4.32 MIL/uL (ref 3.87–5.11)
RDW: 15.3 % (ref 11.5–15.5)
WBC: 4.9 10*3/uL (ref 4.0–10.5)

## 2014-11-07 LAB — POCT PREGNANCY, URINE: Preg Test, Ur: NEGATIVE

## 2014-11-07 SURGERY — LIGATION, FALLOPIAN TUBE, LAPAROSCOPIC
Anesthesia: General | Site: Abdomen | Laterality: Bilateral

## 2014-11-07 MED ORDER — HYDROMORPHONE HCL 2 MG PO TABS
ORAL_TABLET | ORAL | Status: AC
  Filled 2014-11-07: qty 1

## 2014-11-07 MED ORDER — MIDAZOLAM HCL 2 MG/2ML IJ SOLN
INTRAMUSCULAR | Status: AC
  Filled 2014-11-07: qty 2

## 2014-11-07 MED ORDER — DEXAMETHASONE SODIUM PHOSPHATE 4 MG/ML IJ SOLN
INTRAMUSCULAR | Status: DC | PRN
  Administered 2014-11-07: 10 mg via INTRAVENOUS

## 2014-11-07 MED ORDER — HYDROMORPHONE HCL 1 MG/ML IJ SOLN
0.2500 mg | INTRAMUSCULAR | Status: DC | PRN
  Administered 2014-11-07 (×2): 0.25 mg via INTRAVENOUS
  Administered 2014-11-07: 0.5 mg via INTRAVENOUS
  Filled 2014-11-07: qty 1

## 2014-11-07 MED ORDER — MIDAZOLAM HCL 5 MG/5ML IJ SOLN
INTRAMUSCULAR | Status: DC | PRN
  Administered 2014-11-07: 2 mg via INTRAVENOUS

## 2014-11-07 MED ORDER — HYDROMORPHONE HCL 1 MG/ML IJ SOLN
INTRAMUSCULAR | Status: AC
  Filled 2014-11-07: qty 1

## 2014-11-07 MED ORDER — GLYCOPYRROLATE 0.2 MG/ML IJ SOLN
INTRAMUSCULAR | Status: DC | PRN
Start: 2014-11-07 — End: 2014-11-07
  Administered 2014-11-07: 0.4 mg via INTRAVENOUS

## 2014-11-07 MED ORDER — ACETAMINOPHEN 10 MG/ML IV SOLN
INTRAVENOUS | Status: DC | PRN
  Administered 2014-11-07: 1000 mg via INTRAVENOUS

## 2014-11-07 MED ORDER — BUPIVACAINE HCL 0.25 % IJ SOLN
INTRAMUSCULAR | Status: DC | PRN
  Administered 2014-11-07: 6 mL

## 2014-11-07 MED ORDER — PROPOFOL 10 MG/ML IV BOLUS
INTRAVENOUS | Status: DC | PRN
  Administered 2014-11-07: 200 mg via INTRAVENOUS

## 2014-11-07 MED ORDER — ROCURONIUM BROMIDE 100 MG/10ML IV SOLN
INTRAVENOUS | Status: DC | PRN
Start: 2014-11-07 — End: 2014-11-07
  Administered 2014-11-07: 30 mg via INTRAVENOUS

## 2014-11-07 MED ORDER — CEFAZOLIN SODIUM-DEXTROSE 2-3 GM-% IV SOLR
2.0000 g | INTRAVENOUS | Status: AC
  Administered 2014-11-07: 2 g via INTRAVENOUS
  Filled 2014-11-07: qty 50

## 2014-11-07 MED ORDER — KETOROLAC TROMETHAMINE 30 MG/ML IJ SOLN
INTRAMUSCULAR | Status: DC | PRN
  Administered 2014-11-07: 30 mg via INTRAVENOUS

## 2014-11-07 MED ORDER — FENTANYL CITRATE 0.05 MG/ML IJ SOLN
INTRAMUSCULAR | Status: AC
  Filled 2014-11-07: qty 4

## 2014-11-07 MED ORDER — ONDANSETRON HCL 4 MG/2ML IJ SOLN
4.0000 mg | Freq: Once | INTRAMUSCULAR | Status: DC | PRN
  Filled 2014-11-07: qty 2

## 2014-11-07 MED ORDER — SODIUM CHLORIDE 0.9 % IR SOLN
Status: DC | PRN
  Administered 2014-11-07: 500 mL

## 2014-11-07 MED ORDER — NEOSTIGMINE METHYLSULFATE 10 MG/10ML IV SOLN
INTRAVENOUS | Status: DC | PRN
  Administered 2014-11-07: 3 mg via INTRAVENOUS

## 2014-11-07 MED ORDER — LACTATED RINGERS IV SOLN
INTRAVENOUS | Status: DC
  Administered 2014-11-07 (×2): via INTRAVENOUS
  Filled 2014-11-07: qty 1000

## 2014-11-07 MED ORDER — FENTANYL CITRATE 0.05 MG/ML IJ SOLN
INTRAMUSCULAR | Status: DC | PRN
  Administered 2014-11-07 (×2): 50 ug via INTRAVENOUS

## 2014-11-07 MED ORDER — ONDANSETRON HCL 4 MG/2ML IJ SOLN
INTRAMUSCULAR | Status: DC | PRN
  Administered 2014-11-07: 4 mg via INTRAVENOUS

## 2014-11-07 MED ORDER — CEFAZOLIN SODIUM-DEXTROSE 2-3 GM-% IV SOLR
INTRAVENOUS | Status: AC
  Filled 2014-11-07: qty 50

## 2014-11-07 MED ORDER — LIDOCAINE HCL (CARDIAC) 20 MG/ML IV SOLN
INTRAVENOUS | Status: DC | PRN
  Administered 2014-11-07: 80 mg via INTRAVENOUS

## 2014-11-07 MED ORDER — HYDROMORPHONE HCL 2 MG PO TABS: 2.0000 mg | ORAL_TABLET | ORAL | Status: AC | PRN

## 2014-11-07 MED ORDER — HYDROMORPHONE HCL 2 MG PO TABS
2.0000 mg | ORAL_TABLET | ORAL | Status: DC | PRN
  Administered 2014-11-07: 2 mg via ORAL
  Filled 2014-11-07: qty 1

## 2014-11-07 MED ORDER — MEPERIDINE HCL 25 MG/ML IJ SOLN
6.2500 mg | INTRAMUSCULAR | Status: DC | PRN
  Filled 2014-11-07: qty 1

## 2014-11-07 SURGICAL SUPPLY — 39 items
APPLICATOR COTTON TIP 6IN STRL (MISCELLANEOUS) ×3 IMPLANT
BANDAGE ADH SHEER 1  50/CT (GAUZE/BANDAGES/DRESSINGS) IMPLANT
BLADE SURG 11 STRL SS (BLADE) ×3 IMPLANT
CATH ROBINSON RED A/P 16FR (CATHETERS) ×3 IMPLANT
CLIP FILSHIE TUBAL LIGA STRL (Clip) ×3 IMPLANT
CLOSURE WOUND 1/4X4 (GAUZE/BANDAGES/DRESSINGS)
COVER MAYO STAND STRL (DRAPES) ×3 IMPLANT
DRAPE UNDERBUTTOCKS STRL (DRAPE) ×3 IMPLANT
DRSG TELFA 3X8 NADH (GAUZE/BANDAGES/DRESSINGS) IMPLANT
ELECT REM PT RETURN 9FT ADLT (ELECTROSURGICAL) ×3
ELECTRODE REM PT RTRN 9FT ADLT (ELECTROSURGICAL) ×1 IMPLANT
GLOVE BIOGEL PI IND STRL 6.5 (GLOVE) ×1 IMPLANT
GLOVE BIOGEL PI IND STRL 7.5 (GLOVE) ×1 IMPLANT
GLOVE BIOGEL PI INDICATOR 6.5 (GLOVE) ×2
GLOVE BIOGEL PI INDICATOR 7.5 (GLOVE) ×2
GLOVE ECLIPSE 7.0 STRL STRAW (GLOVE) ×6 IMPLANT
GLOVE SURG SS PI 7.5 STRL IVOR (GLOVE) ×3 IMPLANT
GOWN STRL REUS W/TWL LRG LVL3 (GOWN DISPOSABLE) ×6 IMPLANT
LIQUID BAND (GAUZE/BANDAGES/DRESSINGS) ×3 IMPLANT
NEEDLE HYPO 25X1 1.5 SAFETY (NEEDLE) ×3 IMPLANT
NEEDLE INSUFFLATION 14GA 120MM (NEEDLE) ×3 IMPLANT
NS IRRIG 500ML POUR BTL (IV SOLUTION) ×3 IMPLANT
PACK BASIN DAY SURGERY FS (CUSTOM PROCEDURE TRAY) ×3 IMPLANT
PACK LAPAROSCOPY II (CUSTOM PROCEDURE TRAY) ×3 IMPLANT
PAD OB MATERNITY 4.3X12.25 (PERSONAL CARE ITEMS) ×3 IMPLANT
PAD PREP 24X48 CUFFED NSTRL (MISCELLANEOUS) ×3 IMPLANT
SET IRRIG TUBING LAPAROSCOPIC (IRRIGATION / IRRIGATOR) IMPLANT
SOLUTION ANTI FOG 6CC (MISCELLANEOUS) ×3 IMPLANT
STRIP CLOSURE SKIN 1/4X4 (GAUZE/BANDAGES/DRESSINGS) IMPLANT
SUT VICRYL 0 UR6 27IN ABS (SUTURE) ×6 IMPLANT
SYR CONTROL 10ML LL (SYRINGE) ×3 IMPLANT
SYRINGE 10CC LL (SYRINGE) ×3 IMPLANT
TOWEL OR 17X24 6PK STRL BLUE (TOWEL DISPOSABLE) ×6 IMPLANT
TRAY DSU PREP LF (CUSTOM PROCEDURE TRAY) ×3 IMPLANT
TROCAR BALLN 12MMX100 BLUNT (TROCAR) ×3 IMPLANT
TROCAR XCEL BLUNT TIP 100MML (ENDOMECHANICALS) ×3 IMPLANT
TROCAR XCEL NON-BLD 5MMX100MML (ENDOMECHANICALS) ×3 IMPLANT
TUBING INSUFFLATION W/FILTER (TUBING) ×3 IMPLANT
WATER STERILE IRR 500ML POUR (IV SOLUTION) IMPLANT

## 2014-11-07 NOTE — Anesthesia Preprocedure Evaluation (Addendum)
Anesthesia Evaluation  Patient identified by MRN, date of birth, ID band Patient awake    Reviewed: Allergy & Precautions, NPO status , Patient's Chart, lab work & pertinent test results  Airway Mallampati: I  TM Distance: >3 FB Neck ROM: Full    Dental   Pulmonary former smoker,    Pulmonary exam normal       Cardiovascular     Neuro/Psych Anxiety    GI/Hepatic   Endo/Other    Renal/GU      Musculoskeletal   Abdominal   Peds  Hematology   Anesthesia Other Findings   Reproductive/Obstetrics                           Anesthesia Physical Anesthesia Plan  ASA: II  Anesthesia Plan: General   Post-op Pain Management:    Induction: Intravenous  Airway Management Planned: Oral ETT  Additional Equipment:   Intra-op Plan:   Post-operative Plan: Extubation in OR  Informed Consent: I have reviewed the patients History and Physical, chart, labs and discussed the procedure including the risks, benefits and alternatives for the proposed anesthesia with the patient or authorized representative who has indicated his/her understanding and acceptance.   Dental advisory given  Plan Discussed with: CRNA and Surgeon  Anesthesia Plan Comments:        Anesthesia Quick Evaluation

## 2014-11-07 NOTE — Anesthesia Postprocedure Evaluation (Signed)
Anesthesia Post Note  Patient: Sarah Kemp  Procedure(s) Performed: Procedure(s) (LRB): LAPAROSCOPIC TUBAL LIGATION (Bilateral)  Anesthesia type: general  Patient location: PACU  Post pain: Pain level controlled  Post assessment: Patient's Cardiovascular Status Stable  Last Vitals:  Filed Vitals:   11/07/14 1330  BP:   Pulse: 84  Temp:   Resp: 11    Post vital signs: Reviewed and stable  Level of consciousness: sedated  Complications: No apparent anesthesia complications

## 2014-11-07 NOTE — H&P (Signed)
Sarah Kemp is an 34 y.o. [id number] white female, who presents to the OR for a bilateral tubal ligation..   Chief Complaint: HPI:  Past Medical History  Diagnosis Date  . Anxiety   . History of abnormal cervical Pap smear     Past Surgical History  Procedure Laterality Date  . No past surgeries      History reviewed. No pertinent family history. Social History:  reports that she quit smoking about 13 years ago. Her smoking use included Cigarettes. She quit after 6 years of use. She has never used smokeless tobacco. She reports that she does not drink alcohol or use illicit drugs.  Allergies:  Allergies  Allergen Reactions  . Vicodin [Hydrocodone-Acetaminophen] Itching    Medications Prior to Admission  Medication Sig Dispense Refill  . acetaminophen (TYLENOL) 500 MG tablet Take 1,000 mg by mouth every 6 (six) hours as needed for mild pain.    . calcium carbonate (TUMS - DOSED IN MG ELEMENTAL CALCIUM) 500 MG chewable tablet Chew 2-3 tablets by mouth 3 (three) times daily as needed for indigestion or heartburn.    . Prenatal Vit-Fe Fumarate-FA (PRENATAL MULTIVITAMIN) TABS tablet Take 1 tablet by mouth daily at 12 noon.    . traMADol (ULTRAM) 50 MG tablet Take 1 tablet (50 mg total) by mouth every 6 (six) hours. 15 tablet 0       Blood pressure 109/63, pulse 76, temperature 98.6 F (37 C), temperature source Oral, resp. rate 16, height 5' 5.5" (1.664 m), weight 201 lb (91.173 kg), SpO2 96 %, not currently breastfeeding. Lungs: clear to auscultation bilaterally Heart: regular rate and rhythm, S1, S2 normal, no murmur, click, rub or gallop Abdomen: soft, non-tender; bowel sounds normal; no masses,  no organomegaly   Lab Results  Component Value Date   WBC 4.9 11/07/2014   HGB 12.5 11/07/2014   HCT 38.6 11/07/2014   MCV 89.4 11/07/2014   PLT 213 11/07/2014   Lab Results  Component Value Date   PREGTESTUR NEGATIVE 11/07/2014       Patient Active Problem List   Diagnosis Date Noted  . Delayed delivery after SROM (spontaneous rupture of membranes) 09/20/2014  . Supervision of other normal pregnancy 09/20/2014  Imp/ Pt desires permanent steriliaztion Plan/ Proceed with BTL  Aralyn Nowak E 11/07/2014, 12:26 PM

## 2014-11-07 NOTE — Anesthesia Procedure Notes (Signed)
Procedure Name: Intubation Date/Time: 11/07/2014 12:41 PM Performed by: Tyrone NineSAUVE, Lucee Brissett F Pre-anesthesia Checklist: Patient identified, Timeout performed, Emergency Drugs available, Suction available and Patient being monitored Patient Re-evaluated:Patient Re-evaluated prior to inductionOxygen Delivery Method: Circle system utilized Preoxygenation: Pre-oxygenation with 100% oxygen Intubation Type: IV induction Ventilation: Mask ventilation without difficulty Laryngoscope Size: Mac and 3 Grade View: Grade I Tube type: Oral Tube size: 7.0 mm Number of attempts: 1 Placement Confirmation: ETT inserted through vocal cords under direct vision,  breath sounds checked- equal and bilateral and positive ETCO2 Secured at: 21 cm Tube secured with: Tape Dental Injury: Teeth and Oropharynx as per pre-operative assessment

## 2014-11-07 NOTE — Discharge Instructions (Signed)

## 2014-11-07 NOTE — Transfer of Care (Signed)
Immediate Anesthesia Transfer of Care Note  Patient: Sarah Kemp  Procedure(s) Performed: Procedure(s) with comments: LAPAROSCOPIC TUBAL LIGATION (Bilateral) - FILSHIE CLIPS   Patient Location: PACU  Anesthesia Type:General  Level of Consciousness: awake, alert , oriented and patient cooperative  Airway & Oxygen Therapy: Patient Spontanous Breathing and Patient connected to nasal cannula oxygen  Post-op Assessment: Report given to RN and Post -op Vital signs reviewed and stable  Post vital signs: Reviewed and stable  Last Vitals:  Filed Vitals:   11/07/14 1328  BP:   Pulse:   Temp: 37.1 C  Resp:     Complications: No apparent anesthesia complications

## 2014-11-08 ENCOUNTER — Encounter (HOSPITAL_BASED_OUTPATIENT_CLINIC_OR_DEPARTMENT_OTHER): Payer: Self-pay | Admitting: Obstetrics and Gynecology

## 2014-11-21 NOTE — Op Note (Signed)
[facility] [place]:  Basic, Shelina                ACCOUNT NO.:  [id number]  MEDICAL RECORD NO.:  [id number]  LOCATION:                                 FACILITY:  PHYSICIAN:  Malva LimesMark Anderson, M.D.    DATE OF BIRTH:  September 02, 1980  DATE OF PROCEDURE:  11/08/2014 DATE OF DISCHARGE:  11/07/2014                              OPERATIVE REPORT   PREOPERATIVE DIAGNOSIS:  The patient desires permanent sterilization.  POSTOPERATIVE DIAGNOSIS:  The patient desires permanent sterilization.  PROCEDURE:  Laparoscopic tubal ligation with Filshie clips.  SURGEON:  Malva LimesMark Anderson, M.D.  ANESTHESIA:  General and local.  ANTIBIOTICS:  Ancef 2 g.  DRAINS:  Red rubber catheter to bladder.  SPECIMENS:  None.  FINDINGS:  The patient had normal fallopian tubes and ovaries bilaterally.  Pelvis had no endometriosis or adhesions.  The uterus appeared to be normal.  DESCRIPTION OF PROCEDURE:  The patient was taken to the operating room, where general anesthetic was administered without difficulty.  She was then prepped and draped in usual fashion for this procedure.  Her bladder was drained with a red rubber catheter.  A Hulka tenaculum was applied to the anterior cervical lip.  The patient was then draped in usual fashion for this procedure.  The umbilicus was then injected with 0.25% Marcaine.  A vertical skin incision was made, the fascia was grasped with Kochers and entered with Metzenbaum scissors.  The parietal peritoneum was entered with blunt dissection.  The Hasson cannula was placed into the abdominal cavity and 3 liters of carbon dioxide was insufflated.  The patient was then placed in Trendelenburg.  Examination of the pelvic contents was then undertaken.  The Filshie clip applicator was then placed through the scope and the uterus deviated to the patient's right.  The Filshie clip was placed on the left fallopian tube in the isthmic portion.  The clip was placed perpendicular to the tube. The entire tube  appeared to clasp.  The clasp the clasp appeared to be tightly closed.  A similar procedure was performed on the opposite side. At this point, hemostasis appeared to be adequate and procedure was concluded.  The instruments and pneumoperitoneum released.  The fascia was closed with 0 Vicryl suture in a pursestring fashion and skin with Dermabond.  The patient was taken to the recovery room in stable condition.  Hulka tenaculum was removed.  She was discharged to home with Percocet to take p.r.n.  She will follow up in the office in 4 weeks.          ______________________________ Malva LimesMark Anderson, M.D.     MA/MEDQ  D:  11/20/2014  T:  11/21/2014  Job:  [id number]

## 2015-03-08 IMAGING — US US TRANSVAGINAL NON-OB
1 series · 14 of 25 positions shown · non-contrast
Comparison: Pelvic ultrasound 05/06/2011

CLINICAL DATA: Abdominal pain

EXAM:
TRANSABDOMINAL AND TRANSVAGINAL ULTRASOUND OF PELVIS
TECHNIQUE: Both transabdominal and transvaginal ultrasound examinations of the
pelvis were performed. Transabdominal technique was performed for
global imaging of the pelvis including uterus, ovaries, adnexal
regions, and pelvic cul-de-sac. It was necessary to proceed with
endovaginal exam following the transabdominal exam to visualize the
ovaries and endometrium..

[Series 1: us pelvis complete · 14 of 38 slices shown]
[im 1/38]
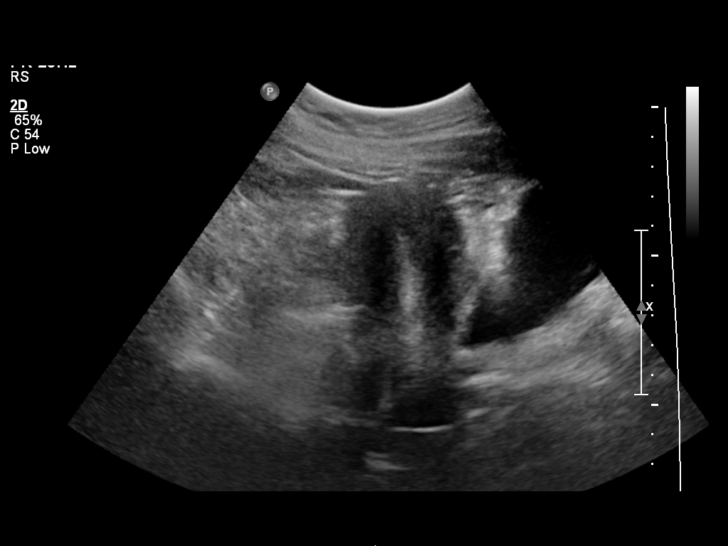
[im 4/38]
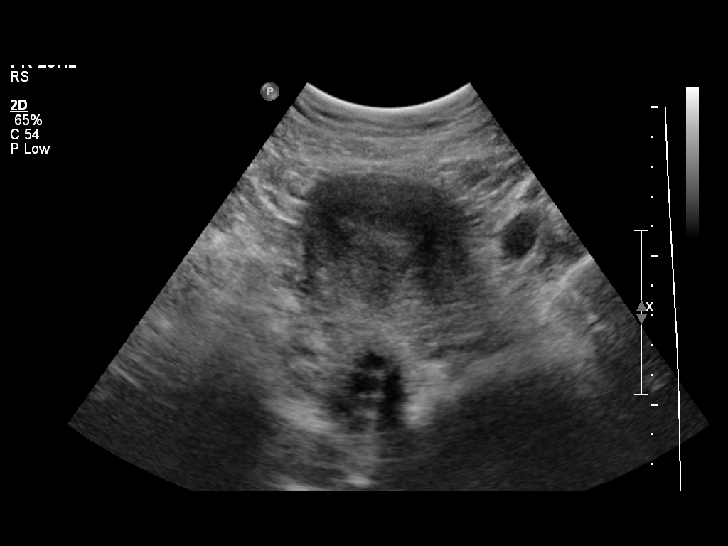
[im 7/38]
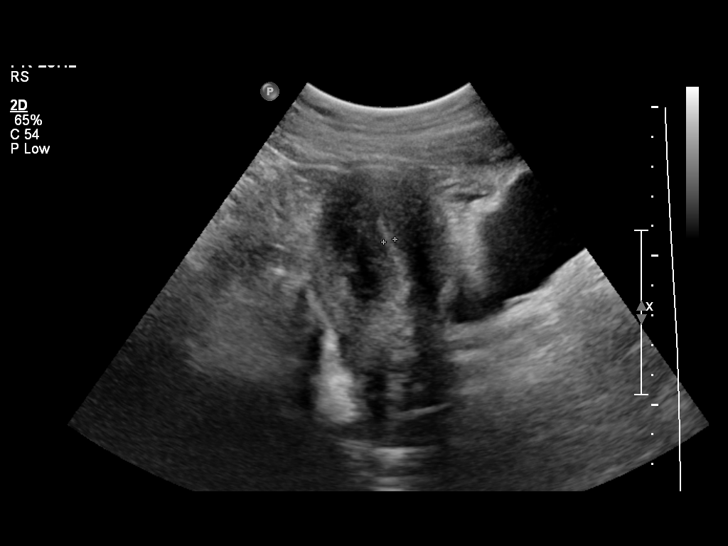
[im 10/38]
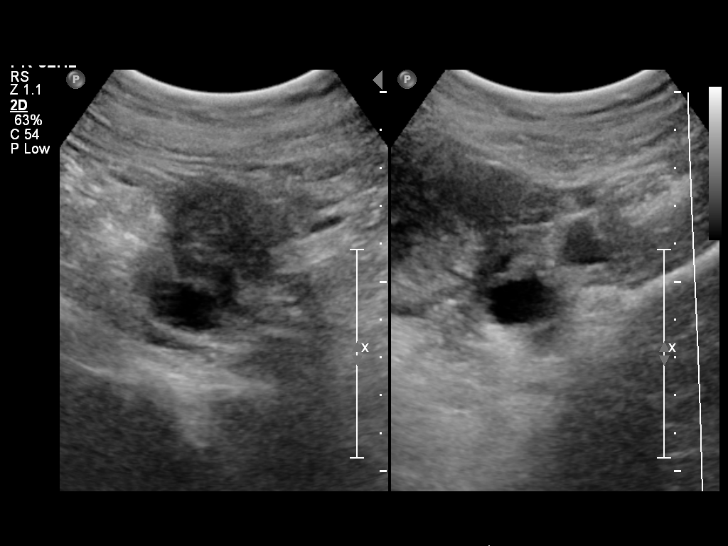
[im 13/38]
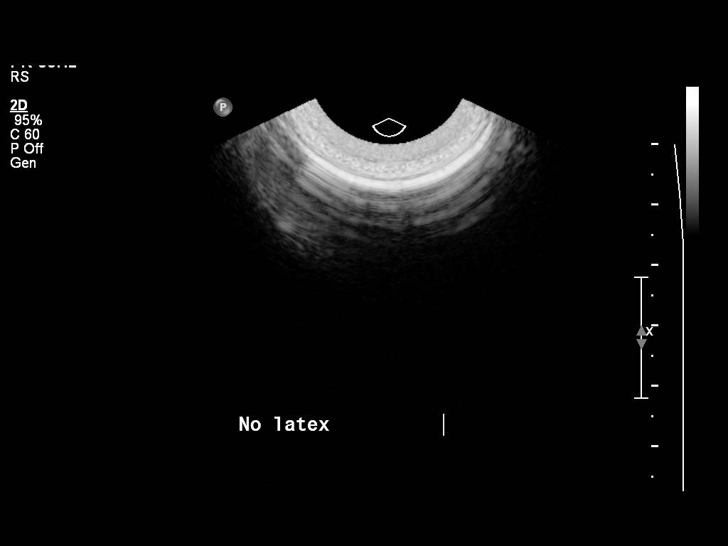
[im 14/38]
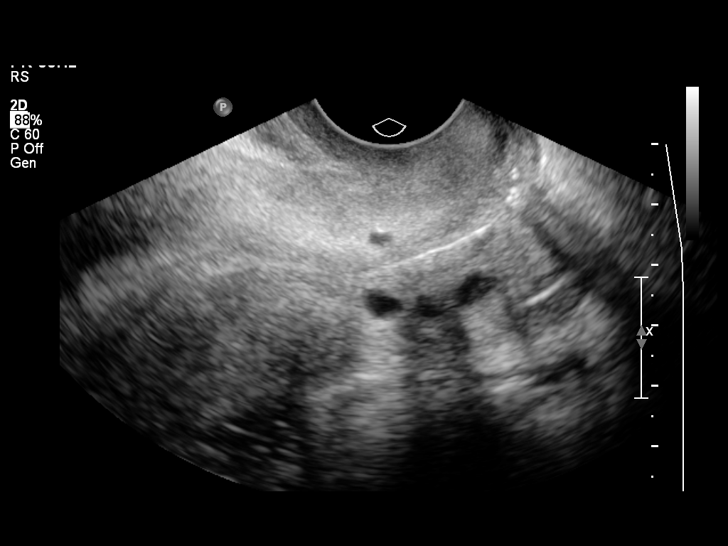
[im 17/38]
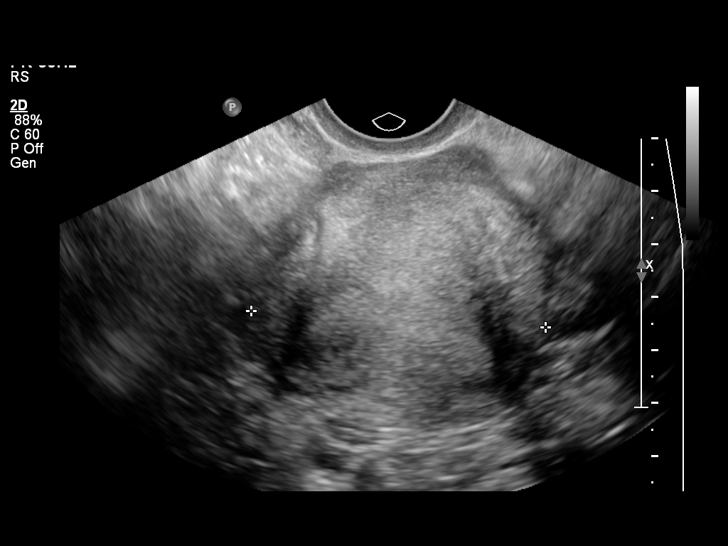
[im 21/38]
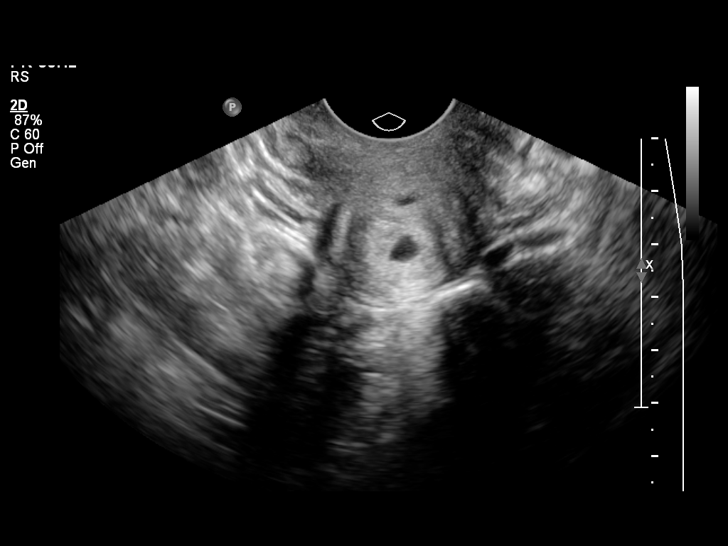
[im 24/38]
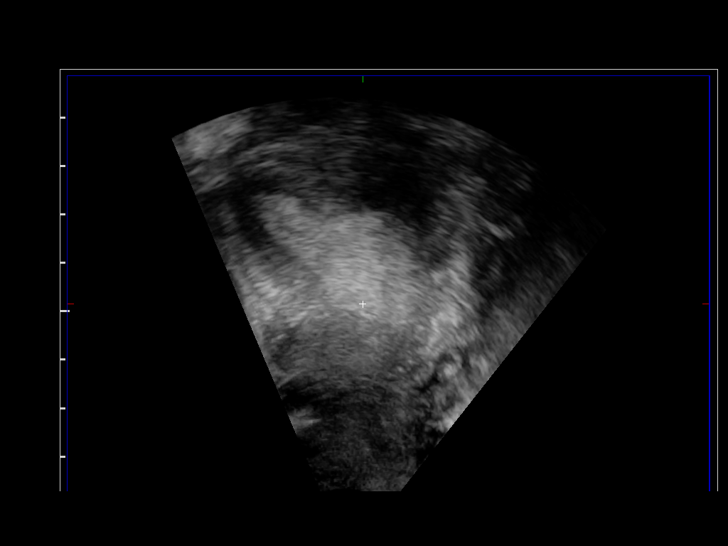
[im 25/38]
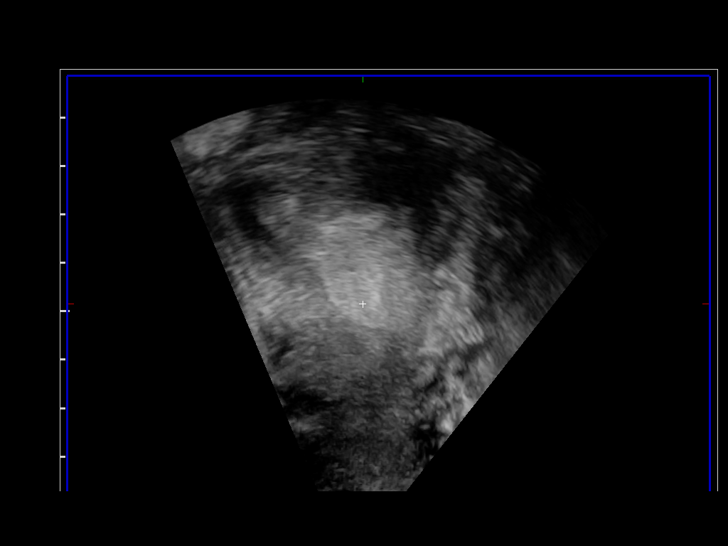
[im 28/38]
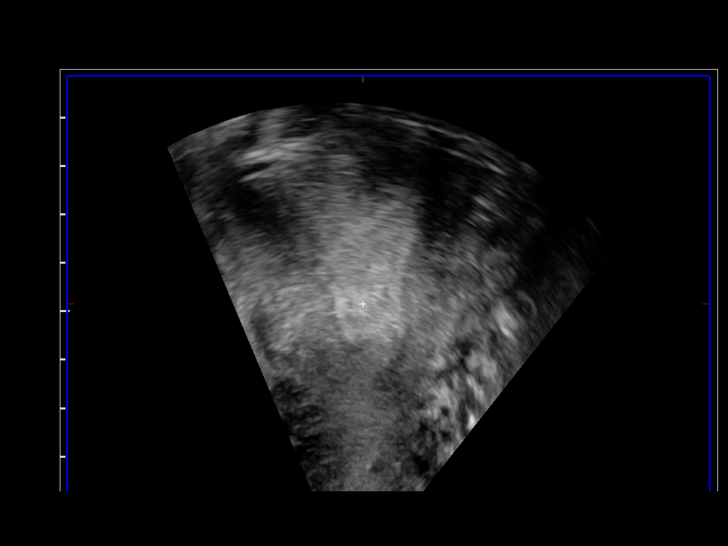
[im 31/38]
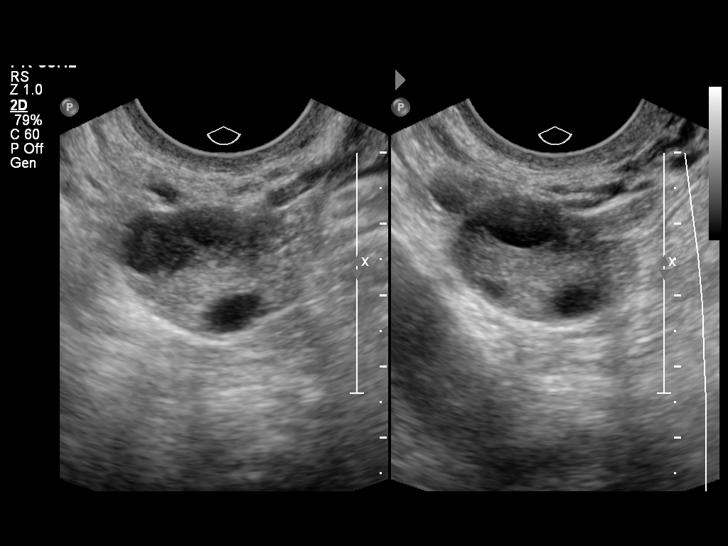
[im 34/38]
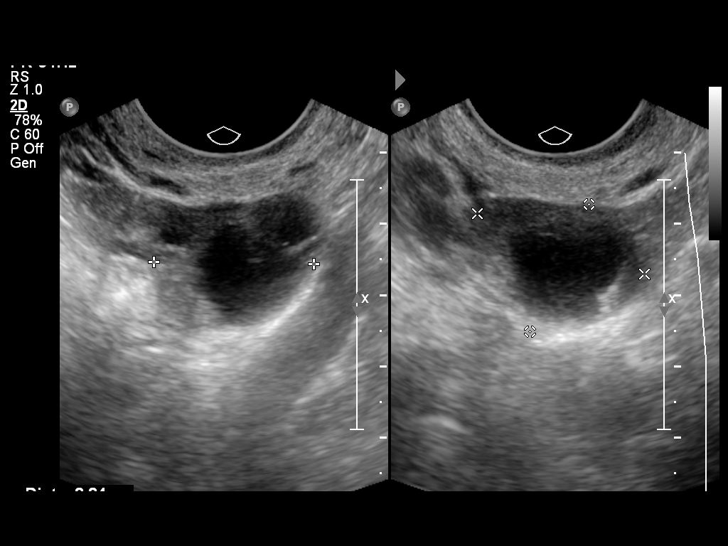
[im 38/38]
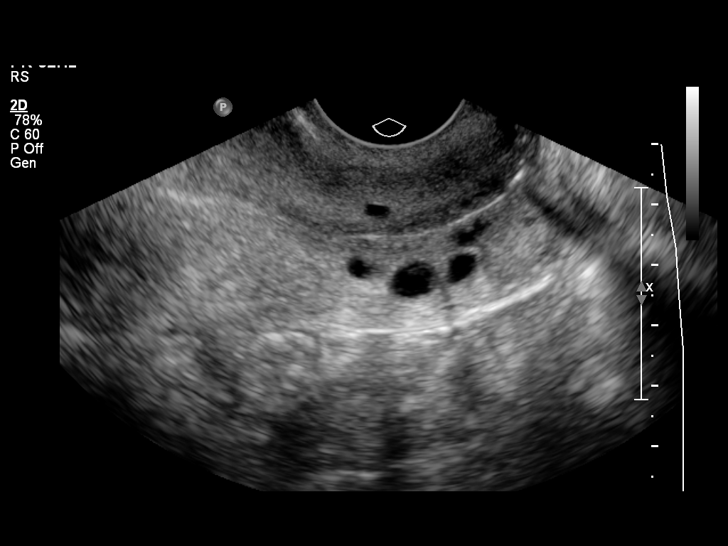

[14 of 25 positions shown; findings below may reference images not displayed]

FINDINGS: Uterus

Measurements: 8.4 x 4.8 x 5.6 cm No fibroids or other mass
visualized.

Endometrium

Thickness: Measures 7.6 mm.  No focal abnormality visualized.

Right ovary

Measurements: Measures 2.6 x 1.6 x 2.3 cm. Normal appearance/no
adnexal mass.

Left ovary

Measurements: Measures 2.2 x 2.5 x 2.0 cm. Normal appearance/no
adnexal mass.

Other findings

No free fluid.
IMPRESSION: Grossly unremarkable uterus and bilateral ovaries.

## 2015-05-07 ENCOUNTER — Emergency Department (HOSPITAL_COMMUNITY)
Admission: EM | Admit: 2015-05-07 | Discharge: 2015-05-07 | Disposition: A | Discharge status: Home / Self Care | Payer: Medicaid Other | Attending: Emergency Medicine | Admitting: Emergency Medicine

## 2015-05-07 ENCOUNTER — Encounter (HOSPITAL_COMMUNITY): Payer: Self-pay

## 2015-05-07 DIAGNOSIS — Z87891 Personal history of nicotine dependence: Secondary | ICD-10-CM | POA: Insufficient documentation

## 2015-05-07 DIAGNOSIS — L259 Unspecified contact dermatitis, unspecified cause: Secondary | ICD-10-CM

## 2015-05-07 DIAGNOSIS — F419 Anxiety disorder, unspecified: Secondary | ICD-10-CM | POA: Insufficient documentation

## 2015-05-07 DIAGNOSIS — Z8742 Personal history of other diseases of the female genital tract: Secondary | ICD-10-CM | POA: Insufficient documentation

## 2015-05-07 MED ORDER — HYDROXYZINE HCL 25 MG PO TABS: 25.0000 mg | ORAL_TABLET | Freq: Four times a day (QID) | ORAL | Status: DC

## 2015-05-07 MED ORDER — DEXAMETHASONE 4 MG PO TABS
10.0000 mg | ORAL_TABLET | Freq: Once | ORAL | Status: DC
  Filled 2015-05-07: qty 3

## 2015-05-07 NOTE — ED Notes (Signed)
Per Pt, Patient was bit by something one month ago. About a week ago, pt started to have a macular rash that started at the bite mark and spread throughout the whole body. Patient denies pain, but reports pruritis. Patient reports switching detergent after the rash began with no relief. Denies any around HEENT.

## 2015-05-07 NOTE — ED Notes (Signed)
MD Floyd at the bedside  

## 2015-05-07 NOTE — Discharge Instructions (Signed)
Contact Dermatitis Dermatitis is redness, soreness, and swelling (inflammation) of the skin. Contact dermatitis is a reaction to certain substances that touch the skin. There are two types of contact dermatitis:   Irritant contact dermatitis. This type is caused by something that irritates your skin, such as dry hands from washing them too much. This type does not require previous exposure to the substance for a reaction to occur. This type is more common.  Allergic contact dermatitis. This type is caused by a substance that you are allergic to, such as a nickel allergy or poison ivy. This type only occurs if you have been exposed to the substance (allergen) before. Upon a repeat exposure, your body reacts to the substance. This type is less common. CAUSES  Many different substances can cause contact dermatitis. Irritant contact dermatitis is most commonly caused by exposure to:   Makeup.   Soaps.   Detergents.   Bleaches.   Acids.   Metal salts, such as nickel.  Allergic contact dermatitis is most commonly caused by exposure to:   Poisonous plants.   Chemicals.   Jewelry.   Latex.   Medicines.   Preservatives in products, such as clothing.  RISK FACTORS This condition is more likely to develop in:   People who have jobs that expose them to irritants or allergens.  People who have certain medical conditions, such as asthma or eczema.  SYMPTOMS  Symptoms of this condition may occur anywhere on your body where the irritant has touched you or is touched by you. Symptoms include:  Dryness or flaking.   Redness.   Cracks.   Itching.   Pain or a burning feeling.   Blisters.  Drainage of small amounts of blood or clear fluid from skin cracks. With allergic contact dermatitis, there may also be swelling in areas such as the eyelids, mouth, or genitals.  DIAGNOSIS  This condition is diagnosed with a medical history and physical exam. A patch skin test  may be performed to help determine the cause. If the condition is related to your job, you may need to see an occupational medicine specialist. TREATMENT Treatment for this condition includes figuring out what caused the reaction and protecting your skin from further contact. Treatment may also include:   Steroid creams or ointments. Oral steroid medicines may be needed in more severe cases.  Antibiotics or antibacterial ointments, if a skin infection is present.  Antihistamine lotion or an antihistamine taken by mouth to ease itching.  A bandage (dressing). HOME CARE INSTRUCTIONS Skin Care  Moisturize your skin as needed.   Apply cool compresses to the affected areas.  Try taking a bath with:  Epsom salts. Follow the instructions on the packaging. You can get these at your local pharmacy or grocery store.  Baking soda. Pour a small amount into the bath as directed by your health care provider.  Colloidal oatmeal. Follow the instructions on the packaging. You can get this at your local pharmacy or grocery store.  Try applying baking soda paste to your skin. Stir water into baking soda until it reaches a paste-like consistency.  Do not scratch your skin.  Bathe less frequently, such as every other day.  Bathe in lukewarm water. Avoid using hot water. Medicines  Take or apply over-the-counter and prescription medicines only as told by your health care provider.   If you were prescribed an antibiotic medicine, take or apply your antibiotic as told by your health care provider. Do not stop using the   antibiotic even if your condition starts to improve. General Instructions  Keep all follow-up visits as told by your health care provider. This is important.  Avoid the substance that caused your reaction. If you do not know what caused it, keep a journal to try to track what caused it. Write down:  What you eat.  What cosmetic products you use.  What you drink.  What  you wear in the affected area. This includes jewelry.  If you were given a dressing, take care of it as told by your health care provider. This includes when to change and remove it. SEEK MEDICAL CARE IF:   Your condition does not improve with treatment.  Your condition gets worse.  You have signs of infection such as swelling, tenderness, redness, soreness, or warmth in the affected area.  You have a fever.  You have new symptoms. SEEK IMMEDIATE MEDICAL CARE IF:   You have a severe headache, neck pain, or neck stiffness.  You vomit.  You feel very sleepy.  You notice red streaks coming from the affected area.  Your bone or joint underneath the affected area becomes painful after the skin has healed.  The affected area turns darker.  You have difficulty breathing.   This information is not intended to replace advice given to you by your health care provider. Make sure you discuss any questions you have with your health care provider.   Document Released: 07/10/2000 Document Revised: 04/03/2015 Document Reviewed: 11/28/2014 Elsevier Interactive Patient Education 2016 [employer]  

## 2015-05-07 NOTE — ED Provider Notes (Signed)
CSN: [id number]     Arrival date & time 05/07/15  9629 History   First MD Initiated Contact with Patient 05/07/15 [phone]     Chief Complaint  Patient presents with  . Rash     (Consider location/radiation/quality/duration/timing/severity/associated sxs/prior Treatment) Patient is a 34 y.o. female presenting with rash. The history is provided by the patient.  Rash Location:  Full body Quality: blistering, itchiness and redness   Severity:  Moderate Onset quality:  Gradual Duration:  1 week Timing:  Constant Progression:  Worsening Chronicity:  New Context: insect bite/sting and new detergent/soap   Relieved by:  Nothing Worsened by:  Nothing tried Ineffective treatments:  None tried Associated symptoms: no fever, no headaches, no joint pain, no myalgias, no nausea, no shortness of breath, no URI, not vomiting and not wheezing    34 yo F with a chief complaint of an itchy rash. This been going on for about a week and a half. Patient states about a month ago she was "bit by something" this was to the anterior aspect of her right shoulder. The patient's history sounds like an abscess. Patient put steroid cream on it and has almost resolved. Patient also had a recent detergent change. The rash is itchy. It spares mucous membranes. Mostly in the upper extremities but also along the chest and abdomen. Spares the hands and feet. Denies fevers chills muscle aches.  Past Medical History  Diagnosis Date  . Anxiety   . History of abnormal cervical Pap smear    Past Surgical History  Procedure Laterality Date  . No past surgeries    . Laparoscopic tubal ligation Bilateral 11/07/2014    Procedure: LAPAROSCOPIC TUBAL LIGATION;  Surgeon: Levi Aland, MD;  Location: [facility];  Service: Gynecology;  Laterality: Bilateral;  FILSHIE CLIPS    No family history on file. Social History  Substance Use Topics  . Smoking status: Former Smoker -- 6 years    Types: Cigarettes   Quit date: 11/01/2001  . Smokeless tobacco: Never Used  . Alcohol Use: No   OB History    Gravida Para Term Preterm AB TAB SAB Ectopic Multiple Living   0  0 0 0 0 4     Review of Systems  Constitutional: Negative for fever and chills.  HENT: Negative for congestion and rhinorrhea.   Eyes: Negative for redness and visual disturbance.  Respiratory: Negative for shortness of breath and wheezing.   Cardiovascular: Negative for chest pain and palpitations.  Gastrointestinal: Negative for nausea and vomiting.  Genitourinary: Negative for dysuria and urgency.  Musculoskeletal: Negative for myalgias and arthralgias.  Skin: Positive for rash. Negative for pallor and wound.  Neurological: Negative for dizziness and headaches.      Allergies  Vicodin  Home Medications   Prior to Admission medications   Medication Sig Start Date End Date Taking? Authorizing Provider  acetaminophen (TYLENOL) 500 MG tablet Take 1,000 mg by mouth every 6 (six) hours as needed for mild pain.    Historical Provider, MD  calcium carbonate (TUMS - DOSED IN MG ELEMENTAL CALCIUM) 500 MG chewable tablet Chew 2-3 tablets by mouth 3 (three) times daily as needed for indigestion or heartburn.    Historical Provider, MD  HYDROmorphone (DILAUDID) 2 MG tablet Take 1 tablet (2 mg total) by mouth every 4 (four) hours as needed for severe pain. 11/07/14   Levi Aland, MD  hydrOXYzine (ATARAX/VISTARIL) 25 MG tablet Take 1 tablet (25 mg  total) by mouth every 6 (six) hours. 05/07/15   Melene Plan, DO  traMADol (ULTRAM) 50 MG tablet Take 1 tablet (50 mg total) by mouth every 6 (six) hours. 09/22/14   Carrington Clamp, MD   BP 111/61 mmHg  Pulse 66  Temp(Src) 98.1 F (36.7 C) (Oral)  Resp 16  SpO2 99% Physical Exam  Constitutional: She is oriented to person, place, and time. She appears well-developed and well-nourished. No distress.  HENT:  Head: Normocephalic and atraumatic.  Eyes: EOM are normal. Pupils are  equal, round, and reactive to light.  Neck: Normal range of motion. Neck supple.  Cardiovascular: Normal rate and regular rhythm.  Exam reveals no gallop and no friction rub.   No murmur heard. Pulmonary/Chest: Effort normal. She has no wheezes. She has no rales.  Abdominal: Soft. She exhibits no distension. There is no tenderness. There is no rebound and no guarding.  Musculoskeletal: She exhibits no edema or tenderness.  Neurological: She is alert and oriented to person, place, and time.  Skin: Skin is warm and dry. She is not diaphoretic.     Psychiatric: She has a normal mood and affect. Her behavior is normal.    ED Course  Procedures (including critical care time) Labs Review Labs Reviewed - No data to display  Imaging Review No results found. I have personally reviewed and evaluated these images and lab results as part of my medical decision-making.   EKG Interpretation None      MDM   Final diagnoses:  Contact dermatitis    34 yo F with a chief complaint of a rash. Psych contact dermatitis. Patient with linear vascular areas consistent with poison ivy. Patient denies any recent outdoor exposure. Denies any exposure to ticks. Not having any myalgias fevers or chills. Spares mucous membranes. Spares palms and soles. No noted distribution to the hands or feet. Feels scabies unlikely. Likely contact dermatitis with recent detergent change. Start on Atarax. Give 1 dose of Decadron. Dermatology follow-up.  7:41 AM:  I have discussed the diagnosis/risks/treatment options with the patient and believe the pt to be eligible for discharge home to follow-up with PCP. We also discussed returning to the ED immediately if new or worsening sx occur. We discussed the sx which are most concerning (e.g., sudden worsening pain, fever, inability to tolerate by mouth) that necessitate immediate return. Medications administered to the patient during their visit and any new prescriptions provided  to the patient are listed below.  Medications given during this visit Medications  dexamethasone (DECADRON) tablet 10 mg (10 mg Oral Not Given 05/07/15 0739)    Discharge Medication List as of 05/07/2015  7:35 AM    START taking these medications   Details  hydrOXYzine (ATARAX/VISTARIL) 25 MG tablet Take 1 tablet (25 mg total) by mouth every 6 (six) hours., Starting 05/07/2015, Until Discontinued, Print        The patient appears reasonably screen and/or stabilized for discharge and I doubt any other medical condition or other [facility] requiring further screening, evaluation, or treatment in the ED at this time prior to discharge.      Melene Plan, DO 05/07/15 [phone]

## 2017-04-20 ENCOUNTER — Other Ambulatory Visit: Payer: Self-pay | Admitting: Obstetrics and Gynecology

## 2017-04-30 ENCOUNTER — Ambulatory Visit (HOSPITAL_COMMUNITY): Admission: RE | Admit: 2017-04-30 | Payer: Self-pay | Source: Ambulatory Visit | Admitting: Obstetrics and Gynecology

## 2017-04-30 ENCOUNTER — Encounter (HOSPITAL_COMMUNITY): Admission: RE | Payer: Self-pay | Source: Ambulatory Visit

## 2017-04-30 SURGERY — DILATATION & CURETTAGE/HYSTEROSCOPY WITH NOVASURE ABLATION
Anesthesia: Choice

## 2019-02-03 ENCOUNTER — Encounter (HOSPITAL_COMMUNITY): Payer: Self-pay

## 2019-02-03 ENCOUNTER — Other Ambulatory Visit: Payer: Self-pay

## 2019-02-03 ENCOUNTER — Ambulatory Visit (HOSPITAL_COMMUNITY)
Admission: EM | Admit: 2019-02-03 | Discharge: 2019-02-03 | Disposition: A | Discharge status: Home / Self Care | Payer: Self-pay | Attending: Internal Medicine | Admitting: Internal Medicine

## 2019-02-03 DIAGNOSIS — F410 Panic disorder [episodic paroxysmal anxiety] without agoraphobia: Secondary | ICD-10-CM | POA: Insufficient documentation

## 2019-02-03 LAB — CBC
HCT: 41.1 % (ref 36.0–46.0)
Hemoglobin: 14.9 g/dL (ref 12.0–15.0)
MCH: 34.5 pg — ABNORMAL HIGH (ref 26.0–34.0)
MCHC: 36.3 g/dL — ABNORMAL HIGH (ref 30.0–36.0)
MCV: 95.1 fL (ref 80.0–100.0)
Platelets: 198 10*3/uL (ref 150–400)
RBC: 4.32 MIL/uL (ref 3.87–5.11)
RDW: 12 % (ref 11.5–15.5)
WBC: 6.9 10*3/uL (ref 4.0–10.5)
nRBC: 0 % (ref 0.0–0.2)

## 2019-02-03 LAB — BASIC METABOLIC PANEL
Anion gap: 9 (ref 5–15)
BUN: 6 mg/dL (ref 6–20)
CO2: 22 mmol/L (ref 22–32)
Calcium: 9.2 mg/dL (ref 8.9–10.3)
Chloride: 106 mmol/L (ref 98–111)
Creatinine, Ser: 1.07 mg/dL — ABNORMAL HIGH (ref 0.44–1.00)
GFR calc Af Amer: >60 mL/min (ref >60)
GFR calc non Af Amer: >60 mL/min (ref >60)
Glucose, Bld: 85 mg/dL (ref 70–99)
Potassium: 3.6 mmol/L (ref 3.5–5.1)
Sodium: 137 mmol/L (ref 135–145)

## 2019-02-03 LAB — TSH: TSH: 3.984 u[IU]/mL (ref 0.350–4.500)

## 2019-02-03 MED ORDER — HYDROXYZINE HCL 25 MG PO TABS: 25.0000 mg | ORAL_TABLET | Freq: Four times a day (QID) | ORAL | 0 refills | Status: AC

## 2019-02-03 NOTE — ED Provider Notes (Signed)
[place]    CSN: [id number] Arrival date & time: 02/03/19  1131     History   Chief Complaint Chief Complaint  Patient presents with  . Numbness & Tingling in Hands, Feet and Legs  . Dizziness  . Shortness of Breath    HPI Sarah Kemp is a 38 y.o. female with history of anxiety comes to urgent care with complaints of tingling in both hands and feet, dizziness and shortness of breath.  Patient was awakened by the symptoms today.  Symptoms somewhat subsided and recurred while she was at work.  She denies any chest pain or chest pressure.  No palpitations.  She denies any perioral numbness or tingling.  She currently feels exhausted.  No fever or chills.  No nausea vomiting.  Symptoms somewhat improved spontaneously  HPI  Past Medical History:  Diagnosis Date  . Anxiety   . History of abnormal cervical Pap smear     Patient Active Problem List   Diagnosis Date Noted  . Delayed delivery after SROM (spontaneous rupture of membranes) 09/20/2014  . Supervision of other normal pregnancy 09/20/2014    Past Surgical History:  Procedure Laterality Date  . LAPAROSCOPIC TUBAL LIGATION Bilateral 11/07/2014   Procedure: LAPAROSCOPIC TUBAL LIGATION;  Surgeon: Olga Millers, MD;  Location: [facility];  Service: Gynecology;  Laterality: Bilateral;  FILSHIE CLIPS   . NO PAST SURGERIES      OB History    Gravida  4   Para  4   Term  4   Preterm  0   AB      Living  4     SAB  0   TAB  0   Ectopic  0   Multiple  0   Live Births  4            Home Medications    Prior to Admission medications   Medication Sig Start Date End Date Taking? Authorizing Provider  acetaminophen (TYLENOL) 500 MG tablet Take 1,000 mg by mouth every 6 (six) hours as needed for mild pain.    [provider]  calcium carbonate (TUMS - DOSED IN MG ELEMENTAL CALCIUM) 500 MG chewable tablet Chew 2-3 tablets by mouth 3 (three) times daily as needed  for indigestion or heartburn.    [provider]  HYDROmorphone (DILAUDID) 2 MG tablet Take 1 tablet (2 mg total) by mouth every 4 (four) hours as needed for severe pain. 11/07/14   Olga Millers, MD  hydrOXYzine (ATARAX/VISTARIL) 25 MG tablet Take 1 tablet (25 mg total) by mouth every 6 (six) hours. 02/03/19   Lamptey, Myrene Galas, MD  traMADol (ULTRAM) 50 MG tablet Take 1 tablet (50 mg total) by mouth every 6 (six) hours. 09/22/14   Bobbye Charleston, MD    Family History Family History  Problem Relation Age of Onset  . Diabetes Mother   . Hypertension Mother   . Stroke Other   . Diabetes Other     Social History Social History   Tobacco Use  . Smoking status: Former Smoker    Years: 6.00    Types: Cigarettes    Quit date: 11/01/2001    Years since quitting: 17.2  . Smokeless tobacco: Never Used  Substance Use Topics  . Alcohol use: No  . Drug use: No     Allergies   Vicodin [hydrocodone-acetaminophen]   Review of Systems Review of Systems  Constitutional: Negative for activity change, chills, fatigue  and fever.  HENT: Negative.   Eyes: Negative.   Respiratory: Negative.   Cardiovascular: Negative.   Gastrointestinal: Negative.   Genitourinary: Negative for dysuria, frequency and urgency.  Musculoskeletal: Negative.   Skin: Negative.   Neurological: Positive for dizziness, weakness, light-headedness and numbness. Negative for tremors, syncope and headaches.     Physical Exam Triage Vital Signs ED Triage Vitals  Enc Vitals Group     BP 02/03/19 1218 109/71     Pulse Rate 02/03/19 1218 78     Resp 02/03/19 1218 17     Temp 02/03/19 1218 98 F (36.7 C)     Temp Source 02/03/19 1218 Oral     SpO2 02/03/19 1218 100 %     Weight --      Height --      Head Circumference --      Peak Flow --      Pain Score 02/03/19 1221 0     Pain Loc --      Pain Edu? --      Excl. in GC? --    No data found.  Updated Vital Signs BP 109/71 (BP Location: Left  Arm)   Pulse 78   Temp 98 F (36.7 C) (Oral)   Resp 17   SpO2 100%   Visual Acuity Right Eye Distance:   Left Eye Distance:   Bilateral Distance:    Right Eye Near:   Left Eye Near:    Bilateral Near:     Physical Exam Constitutional:      Appearance: She is well-developed. She is not ill-appearing or toxic-appearing.  HENT:     Mouth/Throat:     Mouth: Mucous membranes are moist.     Pharynx: No oropharyngeal exudate.  Cardiovascular:     Rate and Rhythm: Normal rate and regular rhythm.  Pulmonary:     Breath sounds: No decreased breath sounds, wheezing, rhonchi or rales.  Chest:     Chest wall: No mass, tenderness or edema.  Abdominal:     General: Bowel sounds are normal.     Palpations: Abdomen is soft. There is no hepatomegaly or mass.     Tenderness: There is no guarding.  Skin:    General: Skin is warm.     Capillary Refill: Capillary refill takes less than 2 seconds.  Neurological:     General: No focal deficit present.     Mental Status: She is alert and oriented to person, place, and time.     Cranial Nerves: No cranial nerve deficit.     Motor: No weakness.      UC Treatments / Results  Labs (all labs ordered are listed, but only abnormal results are displayed) Labs Reviewed  CBC - Abnormal; Notable for the following components:      Result Value   MCH 34.5 (*)    MCHC 36.3 (*)    All other components within normal limits  BASIC METABOLIC PANEL - Abnormal; Notable for the following components:   Creatinine, Ser 1.07 (*)    All other components within normal limits  TSH    EKG   Radiology No results found.  Procedures Procedures (including critical care time)  Medications Ordered in UC Medications - No data to display  Initial Impression / Assessment and Plan / UC Course  I have reviewed the triage vital signs and the nursing notes.  Pertinent labs & imaging results that were available during my care of the patient were reviewed by  me and considered in my medical decision making (see chart for details).     1.  Numbness and tingling in both hands and feet suspect anxiety attack: Hydroxyzine 25 mg every 6 hours as needed for anxiety CBC, BMP, TSH ordered  Final Clinical Impressions(s) / UC Diagnoses   Final diagnoses:  Anxiety attack   Discharge Instructions   None    ED Prescriptions    Medication Sig Dispense Auth. Provider   hydrOXYzine (ATARAX/VISTARIL) 25 MG tablet Take 1 tablet (25 mg total) by mouth every 6 (six) hours. 30 tablet Lamptey, Britta MccreedyPhilip O, MD     Controlled Substance Prescriptions [place] Controlled Substance Registry consulted? No   Merrilee JanskyLamptey, Philip O, MD 02/07/19 1055

## 2019-02-03 NOTE — ED Triage Notes (Signed)
Pt presents with numbness & tingling in both arms, legs and feet since yesterday; pt also complains of dizziness and shortness of breath since this morning.

## 2019-02-07 ENCOUNTER — Telehealth (HOSPITAL_COMMUNITY): Payer: Self-pay | Admitting: Emergency Medicine

## 2019-02-07 NOTE — Telephone Encounter (Signed)
Patient contacted and made aware of blood test results, all questions answered.

## 2020-06-10 ENCOUNTER — Ambulatory Visit (HOSPITAL_COMMUNITY)
Admission: EM | Admit: 2020-06-10 | Discharge: 2020-06-10 | Disposition: A | Discharge status: Home / Self Care | Payer: Self-pay | Attending: Family Medicine | Admitting: Family Medicine

## 2020-06-10 ENCOUNTER — Other Ambulatory Visit: Payer: Self-pay

## 2020-06-10 ENCOUNTER — Ambulatory Visit (INDEPENDENT_AMBULATORY_CARE_PROVIDER_SITE_OTHER): Payer: Self-pay

## 2020-06-10 ENCOUNTER — Encounter (HOSPITAL_COMMUNITY): Payer: Self-pay

## 2020-06-10 DIAGNOSIS — Z20822 Contact with and (suspected) exposure to covid-19: Secondary | ICD-10-CM | POA: Insufficient documentation

## 2020-06-10 DIAGNOSIS — R059 Cough, unspecified: Secondary | ICD-10-CM

## 2020-06-10 DIAGNOSIS — J069 Acute upper respiratory infection, unspecified: Secondary | ICD-10-CM | POA: Insufficient documentation

## 2020-06-10 DIAGNOSIS — Z87891 Personal history of nicotine dependence: Secondary | ICD-10-CM | POA: Insufficient documentation

## 2020-06-10 LAB — RESP PANEL BY RT PCR (RSV, FLU A&B, COVID)
Influenza A by PCR: NEGATIVE
Influenza B by PCR: NEGATIVE
Respiratory Syncytial Virus by PCR: NEGATIVE
SARS Coronavirus 2 by RT PCR: NEGATIVE

## 2020-06-10 MED ORDER — PREDNISONE 20 MG PO TABS: 20.0000 mg | ORAL_TABLET | Freq: Two times a day (BID) | ORAL | 0 refills | Status: AC

## 2020-06-10 MED ORDER — BENZONATATE 200 MG PO CAPS: 200.0000 mg | ORAL_CAPSULE | Freq: Two times a day (BID) | ORAL | 0 refills | Status: AC | PRN

## 2020-06-10 MED ORDER — ALBUTEROL SULFATE HFA 108 (90 BASE) MCG/ACT IN AERS
INHALATION_SPRAY | RESPIRATORY_TRACT | Status: AC
  Filled 2020-06-10: qty 6.7

## 2020-06-10 MED ORDER — ALBUTEROL SULFATE HFA 108 (90 BASE) MCG/ACT IN AERS
2.0000 | INHALATION_SPRAY | Freq: Once | RESPIRATORY_TRACT | Status: AC
  Administered 2020-06-10: 2 via RESPIRATORY_TRACT

## 2020-06-10 MED ORDER — ALBUTEROL SULFATE HFA 108 (90 BASE) MCG/ACT IN AERS: 1.0000 | INHALATION_SPRAY | Freq: Four times a day (QID) | RESPIRATORY_TRACT | 0 refills | Status: AC | PRN

## 2020-06-10 MED ORDER — PREDNISONE 20 MG PO TABS
20.0000 mg | ORAL_TABLET | Freq: Once | ORAL | Status: AC
  Administered 2020-06-10: 20 mg via ORAL

## 2020-06-10 MED ORDER — PREDNISONE 20 MG PO TABS
ORAL_TABLET | ORAL | Status: AC
  Filled 2020-06-10: qty 1

## 2020-06-10 NOTE — Discharge Instructions (Addendum)
You must quarantine until your test results are back Make sure that you are drinking plenty of fluids  take prednisone 2 times a day Take Mucinex DM 1 pill 2 times a day Take Tessalon 1 pill 3 times a day Both of these together will help with the cough Use albuterol every 4-6 hours as needed for wheezing See your doctor if not better by the end of the week Check MyChart for your test results

## 2020-06-10 NOTE — ED Provider Notes (Signed)
[facility]    CSN: [id number] Arrival date & time: 06/10/20  1454      History   Chief Complaint Chief Complaint  Patient presents with  . Cough    HPI Sarah Kemp is a 39 y.o. female.   HPI  Patient states she had a cough for about 2 weeks.  She used to be a smoker but quit many years ago.  She states that she has not been around anyone sick.  She has missed work because of her illness.  She is done to look at home Covid test that were negative.  She has shortness of breath and wheezing.  Coughing.  Coughing at night.  No sputum production.  No headaches or body aches.  No nausea or vomiting.  No loss of taste or smell.  No underlying asthma or known lung disease  Past Medical History:  Diagnosis Date  . Anxiety   . History of abnormal cervical Pap smear     Patient Active Problem List   Diagnosis Date Noted  . Delayed delivery after SROM (spontaneous rupture of membranes) 09/20/2014  . Supervision of other normal pregnancy 09/20/2014    Past Surgical History:  Procedure Laterality Date  . LAPAROSCOPIC TUBAL LIGATION Bilateral 11/07/2014   Procedure: LAPAROSCOPIC TUBAL LIGATION;  Surgeon: Levi Aland, MD;  Location: [facility];  Service: Gynecology;  Laterality: Bilateral;  FILSHIE CLIPS   . NO PAST SURGERIES      OB History    Gravida  4   Para  4   Term  4   Preterm  0   AB      Living  4     SAB  0   TAB  0   Ectopic  0   Multiple  0   Live Births  4            Home Medications    Prior to Admission medications   Medication Sig Start Date End Date Taking? Authorizing Provider  acetaminophen (TYLENOL) 500 MG tablet Take 1,000 mg by mouth every 6 (six) hours as needed for mild pain.    [provider]  albuterol (VENTOLIN HFA) 108 (90 Base) MCG/ACT inhaler Inhale 1-2 puffs into the lungs every 6 (six) hours as needed for wheezing or shortness of breath. 06/10/20   Eustace Ackroyd, MD    benzonatate (TESSALON) 200 MG capsule Take 1 capsule (200 mg total) by mouth 2 (two) times daily as needed for cough. 06/10/20   Eustace Pitzer, MD  calcium carbonate (TUMS - DOSED IN MG ELEMENTAL CALCIUM) 500 MG chewable tablet Chew 2-3 tablets by mouth 3 (three) times daily as needed for indigestion or heartburn.    [provider]  HYDROmorphone (DILAUDID) 2 MG tablet Take 1 tablet (2 mg total) by mouth every 4 (four) hours as needed for severe pain. 11/07/14   Levi Aland, MD  hydrOXYzine (ATARAX/VISTARIL) 25 MG tablet Take 1 tablet (25 mg total) by mouth every 6 (six) hours. 02/03/19   Merrilee Jansky, MD  predniSONE (DELTASONE) 20 MG tablet Take 1 tablet (20 mg total) by mouth 2 (two) times daily with a meal. 06/10/20   Eustace Kahler, MD  traMADol (ULTRAM) 50 MG tablet Take 1 tablet (50 mg total) by mouth every 6 (six) hours. 09/22/14   Carrington Clamp, MD    Family History Family History  Problem Relation Age of Onset  . Diabetes Mother   .  Hypertension Mother   . Stroke Other   . Diabetes Other     Social History Social History   Tobacco Use  . Smoking status: Former Smoker    Years: 6.00    Types: Cigarettes    Quit date: 11/01/2001    Years since quitting: 18.6  . Smokeless tobacco: Never Used  Substance Use Topics  . Alcohol use: No  . Drug use: No     Allergies   Vicodin [hydrocodone-acetaminophen]   Review of Systems Review of Systems See HPI  Physical Exam Triage Vital Signs ED Triage Vitals  Enc Vitals Group     BP 06/10/20 1625 122/78     Pulse Rate 06/10/20 1625 60     Resp 06/10/20 1625 17     Temp 06/10/20 1625 98.1 F (36.7 C)     Temp Source 06/10/20 1625 Oral     SpO2 06/10/20 1625 94 %     Weight --      Height --      Head Circumference --      Peak Flow --      Pain Score 06/10/20 1627 2     Pain Loc --      Pain Edu? --      Excl. in GC? --    No data found.  Updated Vital Signs BP 122/78 (BP Location:  Right Arm)   Pulse 60   Temp 98.1 F (36.7 C) (Oral)   Resp 17   LMP 05/27/2020   SpO2 94%      Physical Exam Constitutional:      General: She is not in acute distress.    Appearance: She is well-developed and normal weight.  HENT:     Head: Normocephalic and atraumatic.     Nose:     Comments: Mask is in place Eyes:     Conjunctiva/sclera: Conjunctivae normal.     Pupils: Pupils are equal, round, and reactive to light.  Cardiovascular:     Rate and Rhythm: Normal rate.  Pulmonary:     Effort: Pulmonary effort is normal. No respiratory distress.     Breath sounds: Wheezing and rhonchi present.     Comments: Scattered wheeze and rhonchi throughout both lungs.  After albuterol second lung exam reveals improvement in the wheezing, but still has inspiratory wheezes throughout. Abdominal:     Palpations: Abdomen is soft.  Musculoskeletal:        General: Normal range of motion.     Cervical back: Normal range of motion.  Skin:    General: Skin is warm and dry.  Neurological:     Mental Status: She is alert.  Psychiatric:        Mood and Affect: Mood normal.        Behavior: Behavior normal.      UC Treatments / Results  Labs (all labs ordered are listed, but only abnormal results are displayed) Labs Reviewed  RESP PANEL BY RT PCR (RSV, FLU A&B, COVID)    EKG   Radiology DG Chest 2 View  Result Date: 06/10/2020 CLINICAL DATA:  Cough EXAM: CHEST - 2 VIEW COMPARISON:  12/06/2005 FINDINGS: The heart size and mediastinal contours are within normal limits. Both lungs are clear. The visualized skeletal structures are unremarkable. IMPRESSION: No acute abnormality of the lungs. Electronically Signed   By: Lauralyn Primes M.D.   On: 06/10/2020 18:02    Procedures Procedures (including critical care time)  Medications Ordered in UC Medications  albuterol (VENTOLIN HFA) 108 (90 Base) MCG/ACT inhaler 2 puff (2 puffs Inhalation Given 06/10/20 1805)  predniSONE (DELTASONE)  tablet 20 mg (20 mg Oral Given 06/10/20 1831)    Initial Impression / Assessment and Plan / UC Course  I have reviewed the triage vital signs and the nursing notes.  Pertinent labs & imaging results that were available during my care of the patient were reviewed by me and considered in my medical decision making (see chart for details).      Final Clinical Impressions(s) / UC Diagnoses   Final diagnoses:  Viral URI with cough     Discharge Instructions     You must quarantine until your test results are back Make sure that you are drinking plenty of fluids  take prednisone 2 times a day Take Mucinex DM 1 pill 2 times a day Take Tessalon 1 pill 3 times a day Both of these together will help with the cough Use albuterol every 4-6 hours as needed for wheezing See your doctor if not better by the end of the week Check MyChart for your test results     ED Prescriptions    Medication Sig Dispense Auth. Provider   predniSONE (DELTASONE) 20 MG tablet Take 1 tablet (20 mg total) by mouth 2 (two) times daily with a meal. 10 tablet Eustace Heinicke, MD   benzonatate (TESSALON) 200 MG capsule Take 1 capsule (200 mg total) by mouth 2 (two) times daily as needed for cough. 20 capsule Eustace Games, MD   albuterol (VENTOLIN HFA) 108 (90 Base) MCG/ACT inhaler Inhale 1-2 puffs into the lungs every 6 (six) hours as needed for wheezing or shortness of breath. 18 g Eustace Granade, MD     PDMP not reviewed this encounter.   Eustace Shea, MD 06/10/20 [phone]

## 2020-06-10 NOTE — ED Triage Notes (Signed)
Pt presents with ongoing non productive cough for over 2 weeks with no relief with prescribed and OTC medication; pt states she had 2 negative covid test in the past 2 weeks.

## 2020-12-05 ENCOUNTER — Other Ambulatory Visit: Payer: Self-pay | Admitting: *Deleted

## 2021-02-26 ENCOUNTER — Other Ambulatory Visit: Payer: Self-pay

## 2021-02-26 ENCOUNTER — Emergency Department
Admission: EM | Admit: 2021-02-26 | Discharge: 2021-02-26 | Disposition: A | Discharge status: Home / Self Care | Payer: Medicaid Other | Attending: Emergency Medicine | Admitting: Emergency Medicine

## 2021-02-26 ENCOUNTER — Emergency Department: Payer: Medicaid Other

## 2021-02-26 DIAGNOSIS — U071 COVID-19: Secondary | ICD-10-CM | POA: Insufficient documentation

## 2021-02-26 DIAGNOSIS — Z87891 Personal history of nicotine dependence: Secondary | ICD-10-CM | POA: Insufficient documentation

## 2021-02-26 LAB — CBC
HCT: 38.8 % (ref 36.0–46.0)
Hemoglobin: 14.3 g/dL (ref 12.0–15.0)
MCH: 34.4 pg — ABNORMAL HIGH (ref 26.0–34.0)
MCHC: 36.9 g/dL — ABNORMAL HIGH (ref 30.0–36.0)
MCV: 93.3 fL (ref 80.0–100.0)
Platelets: 183 10*3/uL (ref 150–400)
RBC: 4.16 MIL/uL (ref 3.87–5.11)
RDW: 12.1 % (ref 11.5–15.5)
WBC: 3.1 10*3/uL — ABNORMAL LOW (ref 4.0–10.5)
nRBC: 0 % (ref 0.0–0.2)

## 2021-02-26 LAB — BASIC METABOLIC PANEL
Anion gap: 10 (ref 5–15)
BUN: 7 mg/dL (ref 6–20)
CO2: 19 mmol/L — ABNORMAL LOW (ref 22–32)
Calcium: 9.8 mg/dL (ref 8.9–10.3)
Chloride: 106 mmol/L (ref 98–111)
Creatinine, Ser: 0.96 mg/dL (ref 0.44–1.00)
GFR, Estimated: >60 mL/min (ref >60)
Glucose, Bld: 102 mg/dL — ABNORMAL HIGH (ref 70–99)
Potassium: 3.5 mmol/L (ref 3.5–5.1)
Sodium: 135 mmol/L (ref 135–145)

## 2021-02-26 LAB — TROPONIN I (HIGH SENSITIVITY)
Troponin I (High Sensitivity): 2 ng/L (ref <18)
Troponin I (High Sensitivity): <2 ng/L (ref <18)

## 2021-02-26 MED ORDER — NIRMATRELVIR/RITONAVIR (PAXLOVID)TABLET: 3.0000 | ORAL_TABLET | Freq: Two times a day (BID) | ORAL | 0 refills | Status: AC

## 2021-02-26 MED ORDER — SODIUM CHLORIDE 0.9 % IV BOLUS
1000.0000 mL | Freq: Once | INTRAVENOUS | Status: AC
  Administered 2021-02-26: 1000 mL via INTRAVENOUS

## 2021-02-26 MED ORDER — ONDANSETRON HCL 4 MG/2ML IJ SOLN
4.0000 mg | Freq: Once | INTRAMUSCULAR | Status: AC
  Administered 2021-02-26: 4 mg via INTRAVENOUS
  Filled 2021-02-26: qty 2

## 2021-02-26 MED ORDER — BENZONATATE 100 MG PO CAPS
200.0000 mg | ORAL_CAPSULE | Freq: Once | ORAL | Status: AC
  Administered 2021-02-26: 200 mg via ORAL
  Filled 2021-02-26: qty 2

## 2021-02-26 MED ORDER — ACETAMINOPHEN 325 MG PO TABS
650.0000 mg | ORAL_TABLET | Freq: Once | ORAL | Status: AC
  Administered 2021-02-26: 650 mg via ORAL
  Filled 2021-02-26: qty 2

## 2021-02-26 MED ORDER — DM-GUAIFENESIN ER 30-600 MG PO TB12
1.0000 | ORAL_TABLET | Freq: Once | ORAL | Status: AC
  Administered 2021-02-26: 1 via ORAL
  Filled 2021-02-26: qty 1

## 2021-02-26 MED ORDER — ONDANSETRON 4 MG PO TBDP: 4.0000 mg | ORAL_TABLET | Freq: Three times a day (TID) | ORAL | 0 refills | Status: AC | PRN

## 2021-02-26 NOTE — ED Notes (Signed)
MD at bedside. 

## 2021-02-26 NOTE — ED Notes (Signed)
Placed patient on 1L via [place] for comfort. Patient states she feels "so much better" after O2 applied.

## 2021-02-26 NOTE — ED Provider Notes (Signed)
[facility] Emergency Department Provider Note  ____________________________________________   Event Date/Time   First MD Initiated Contact with Patient 02/26/21 1734     (approximate)  I have reviewed the triage vital signs and the nursing notes.   HISTORY  Chief Complaint Covid Positive    HPI [name] is a 40 y.o. female with known COVID who comes in with shortness of breath and cough.  Patient reports having symptoms since Monday.  She did test positive for COVID last night.  Patient reports stating that she feels terrible.  She states that she had COVID last in December and that she almost died from it.  I asked patient if she had to be hospitalized and she stated no.  I asked if she ever had to be intubated and she stated no but I should have been.  Patient then stated that she was just being sarcastic.  I explained to patient that her oxygen levels look good.  I asked her if she had new underlying lung issues and she immediately stated what is wrong with my chest x-ray.  Explained to patient that her chest x-ray was normal.  Patient states that she has been feeling her symptoms for 3 days, constant, reporting chest pain from all the coughing and shortness of breath.  Does report taking Tylenol today last around noon.  She states that she did try an inhaler that she got when she previously had COVID and denies that it was helping at all.          Past Medical History:  Diagnosis Date   Anxiety    History of abnormal cervical Pap smear     Patient Active Problem List   Diagnosis Date Noted   Delayed delivery after SROM (spontaneous rupture of membranes) 09/20/2014   Supervision of other normal pregnancy 09/20/2014    Past Surgical History:  Procedure Laterality Date   LAPAROSCOPIC TUBAL LIGATION Bilateral 11/07/2014   Procedure: LAPAROSCOPIC TUBAL LIGATION;  Surgeon: Levi AlandMark E Anderson, MD;  [facility] [place];  Service:  Gynecology;  Laterality: Bilateral;  FILSHIE CLIPS    NO PAST SURGERIES      Prior to Admission medications   Medication Sig Start Date End Date Taking? Authorizing Provider  acetaminophen (TYLENOL) 500 MG tablet Take 1,000 mg by mouth every 6 (six) hours as needed for mild pain.    [provider]  albuterol (VENTOLIN HFA) 108 (90 Base) MCG/ACT inhaler Inhale 1-2 puffs into the lungs every 6 (six) hours as needed for wheezing or shortness of breath. 06/10/20   Eustace MooreNelson, Yvonne Sue, MD  benzonatate (TESSALON) 200 MG capsule Take 1 capsule (200 mg total) by mouth 2 (two) times daily as needed for cough. 06/10/20   Eustace MooreNelson, Yvonne Sue, MD  calcium carbonate (TUMS - DOSED IN MG ELEMENTAL CALCIUM) 500 MG chewable tablet Chew 2-3 tablets by mouth 3 (three) times daily as needed for indigestion or heartburn.    [provider]  HYDROmorphone (DILAUDID) 2 MG tablet Take 1 tablet (2 mg total) by mouth every 4 (four) hours as needed for severe pain. 11/07/14   Levi AlandAnderson, Mark E, MD  hydrOXYzine (ATARAX/VISTARIL) 25 MG tablet Take 1 tablet (25 mg total) by mouth every 6 (six) hours. 02/03/19   Merrilee JanskyLamptey, Philip O, MD  predniSONE (DELTASONE) 20 MG tablet Take 1 tablet (20 mg total) by mouth 2 (two) times daily with a meal. 06/10/20   Eustace MooreNelson, Yvonne Sue, MD  traMADol Janean Sark(ULTRAM)  50 MG tablet Take 1 tablet (50 mg total) by mouth every 6 (six) hours. 09/22/14   Carrington Clamp, MD    Allergies Vicodin [hydrocodone-acetaminophen]  Family History  Problem Relation Age of Onset   Diabetes Mother    Hypertension Mother    Stroke Other    Diabetes Other     Social History Social History   Tobacco Use   Smoking status: Former    Years: 6.00    Types: Cigarettes    Quit date: 11/01/2001    Years since quitting: 19.3   Smokeless tobacco: Never  Substance Use Topics   Alcohol use: No   Drug use: No      Review of Systems Constitutional: No fever/chills Eyes: No visual changes. ENT: No  sore throat. Cardiovascular: Positive chest pain Respiratory: Positive for SOB, cough Gastrointestinal: No abdominal pain.  No nausea, no vomiting.  No diarrhea.  No constipation. Genitourinary: Negative for dysuria. Musculoskeletal: Negative for back pain. Skin: Negative for rash. Neurological: Negative for headaches, focal weakness or numbness. All other ROS negative ____________________________________________   PHYSICAL EXAM:  VITAL SIGNS: ED Triage Vitals  Enc Vitals Group     BP 02/26/21 1438 101/76     Pulse Rate 02/26/21 1438 86     Resp 02/26/21 1438 (!) 26     Temp 02/26/21 1436 97.6 F (36.4 C)     Temp Source 02/26/21 1436 Oral     SpO2 02/26/21 1438 100 %     Weight 02/26/21 1436 200 lb (90.7 kg)     Height 02/26/21 1436 5\' 4"  (1.626 m)     Head Circumference --      Peak Flow --      Pain Score --      Pain Loc --      Pain Edu? --      Excl. in GC? --     Constitutional: Alert and oriented. Well appearing and in no acute distress. Eyes: Conjunctivae are normal. EOMI. Head: Atraumatic. Nose: No congestion/rhinnorhea. Mouth/Throat: Mucous membranes are moist.   Neck: No stridor. Trachea Midline. FROM Cardiovascular: Normal rate, regular rhythm. Grossly normal heart sounds.  Good peripheral circulation. Respiratory: Clear lungs, no wheezing. Gastrointestinal: Soft and nontender. No distention. No abdominal bruits.  Musculoskeletal: No lower extremity tenderness nor edema.  No joint effusions. Neurologic:  Normal speech and language. No gross focal neurologic deficits are appreciated.  Skin:  Skin is warm, dry and intact. No rash noted. Psychiatric: Mood and affect are normal. Speech and behavior are normal. GU: Deferred   ____________________________________________   LABS (all labs ordered are listed, but only abnormal results are displayed)  Labs Reviewed  BASIC METABOLIC PANEL - Abnormal; Notable for the following components:      Result Value    CO2 19 (*)    Glucose, Bld 102 (*)    All other components within normal limits  CBC - Abnormal; Notable for the following components:   WBC 3.1 (*)    MCH 34.4 (*)    MCHC 36.9 (*)    All other components within normal limits  POC URINE PREG, ED  TROPONIN I (HIGH SENSITIVITY)  TROPONIN I (HIGH SENSITIVITY)   ____________________________________________   ED ECG REPORT I, , the attending physician, personally viewed and interpreted this ECG.  Normal sinus rate of 83, no ST elevation, no T wave inversions, normal intervals  ____________________________________________  RADIOLOGY Concha Se, personally viewed and evaluated these images (plain radiographs)  as part of my medical decision making, as well as reviewing the written report by the radiologist.  ED MD interpretation: No pneumonia noted  Official radiology report(s): DG Chest 2 View  Result Date: 02/26/2021 CLINICAL DATA:  Short of breath, cough, COVID-19 positive EXAM: CHEST - 2 VIEW COMPARISON:  06/10/2020 FINDINGS: The heart size and mediastinal contours are within normal limits. Both lungs are clear. The visualized skeletal structures are unremarkable. IMPRESSION: No active cardiopulmonary disease. Electronically Signed   By: Sharlet Salina M.D.   On: 02/26/2021 15:31    ____________________________________________   PROCEDURES  Procedure(s) performed (including Critical Care):  .1-3 Lead EKG Interpretation  Date/Time: 02/26/2021 9:17 PM Performed by: Concha Se, MD Authorized by: Concha Se, MD     Interpretation: normal     ECG rate:  60s   ECG rate assessment: normal     Rhythm: sinus rhythm     Ectopy: none     Conduction: normal     ____________________________________________   INITIAL IMPRESSION / ASSESSMENT AND PLAN / ED COURSE   Sarah Kemp was evaluated in Emergency Department on 02/26/2021 for the symptoms described in the history of present illness. She was evaluated  in the context of the global COVID-19 pandemic, which necessitated consideration that the patient might be at risk for infection with the SARS-CoV-2 virus that causes COVID-19. Institutional protocols and algorithms that pertain to the evaluation of patients at risk for COVID-19 are in a state of rapid change based on information released by regulatory bodies including the CDC and federal and state organizations. These policies and algorithms were followed during the patient's care in the ED.     Pt presents with SOB.  Patient is day 3 of COVID.  Discussed with patient that I was not sure if she would meet any criteria for be Paxlovid with treatment but I would discuss with pharmacy if she could be prescribed it.  Will give symptomatic treatment and reevaluate.  PNA-will get xray to evaluation Anemia-CBC to evaluate ACS- will get trops Arrhythmia-Will get EKG and keep on monitor.  COVID- will get testing per algorithm. PE-lower suspicion given no risk factors and other cause more likely given the COVID.  Patient's not hypoxic and her time course is more consistent with COVID.  Cardiac marker is negative.  White count is low but that could be secondary to the COVID.  Chest x-ray without evidence of pneumonia  Patient was requesting discharge home.  Was able to get a hold of pharmacy and they stated that we could do the paxlovid and she did not need to have risk factors to be prescribed it.  I was delayed getting into the room given I had another critical patient requiring intubation when I came back to the room patient had eloped.  I attempted to check multiple times and she was not in the room on multiple checks.   Attempted to call pt to see if she wanted me to prescribe paxlovid and she is agreeable to rx.  ____________________________________________   FINAL CLINICAL IMPRESSION(S) / ED DIAGNOSES   Final diagnoses:  COVID-19     MEDICATIONS GIVEN DURING THIS VISIT:  Medications   acetaminophen (TYLENOL) tablet 650 mg (650 mg Oral Given 02/26/21 1839)  sodium chloride 0.9 % bolus 1,000 mL (0 mLs Intravenous Stopped 02/26/21 2016)  ondansetron (ZOFRAN) injection 4 mg (4 mg Intravenous Given 02/26/21 1842)  benzonatate (TESSALON) capsule 200 mg (200 mg Oral Given 02/26/21 1842)  dextromethorphan-guaiFENesin (  MUCINEX DM) 30-600 MG per 12 hr tablet 1 tablet (1 tablet Oral Given 02/26/21 1849)     ED Discharge Orders          Ordered    nirmatrelvir/ritonavir EUA (PAXLOVID) TABS  2 times daily        02/26/21 2120    ondansetron (ZOFRAN ODT) 4 MG disintegrating tablet  Every 8 hours PRN        02/26/21 2120             Note:  This document was prepared using Dragon voice recognition software and may include unintentional dictation errors.   Concha Se, MD 02/26/21 2121

## 2021-02-26 NOTE — ED Notes (Signed)
MD Fuller Plan notified that patient requested discharge home

## 2021-02-26 NOTE — ED Triage Notes (Signed)
Pt to ED for shob, cough since Monday, tested positive for covid last night. Reports pressure in chest  States no relief with inhalers.  Ambulatory

## 2022-12-31 IMAGING — CR DG CHEST 2V
1 series · 2 of 2 positions shown · non-contrast
Comparison: 06/10/2020

CLINICAL DATA: Short of breath, cough, KLOKZ-KM positive

EXAM:
CHEST - 2 VIEW

[Series 1: dg chest 2 view · 0.14mm/px · 2 of 2 slices shown]
[im 1/2]
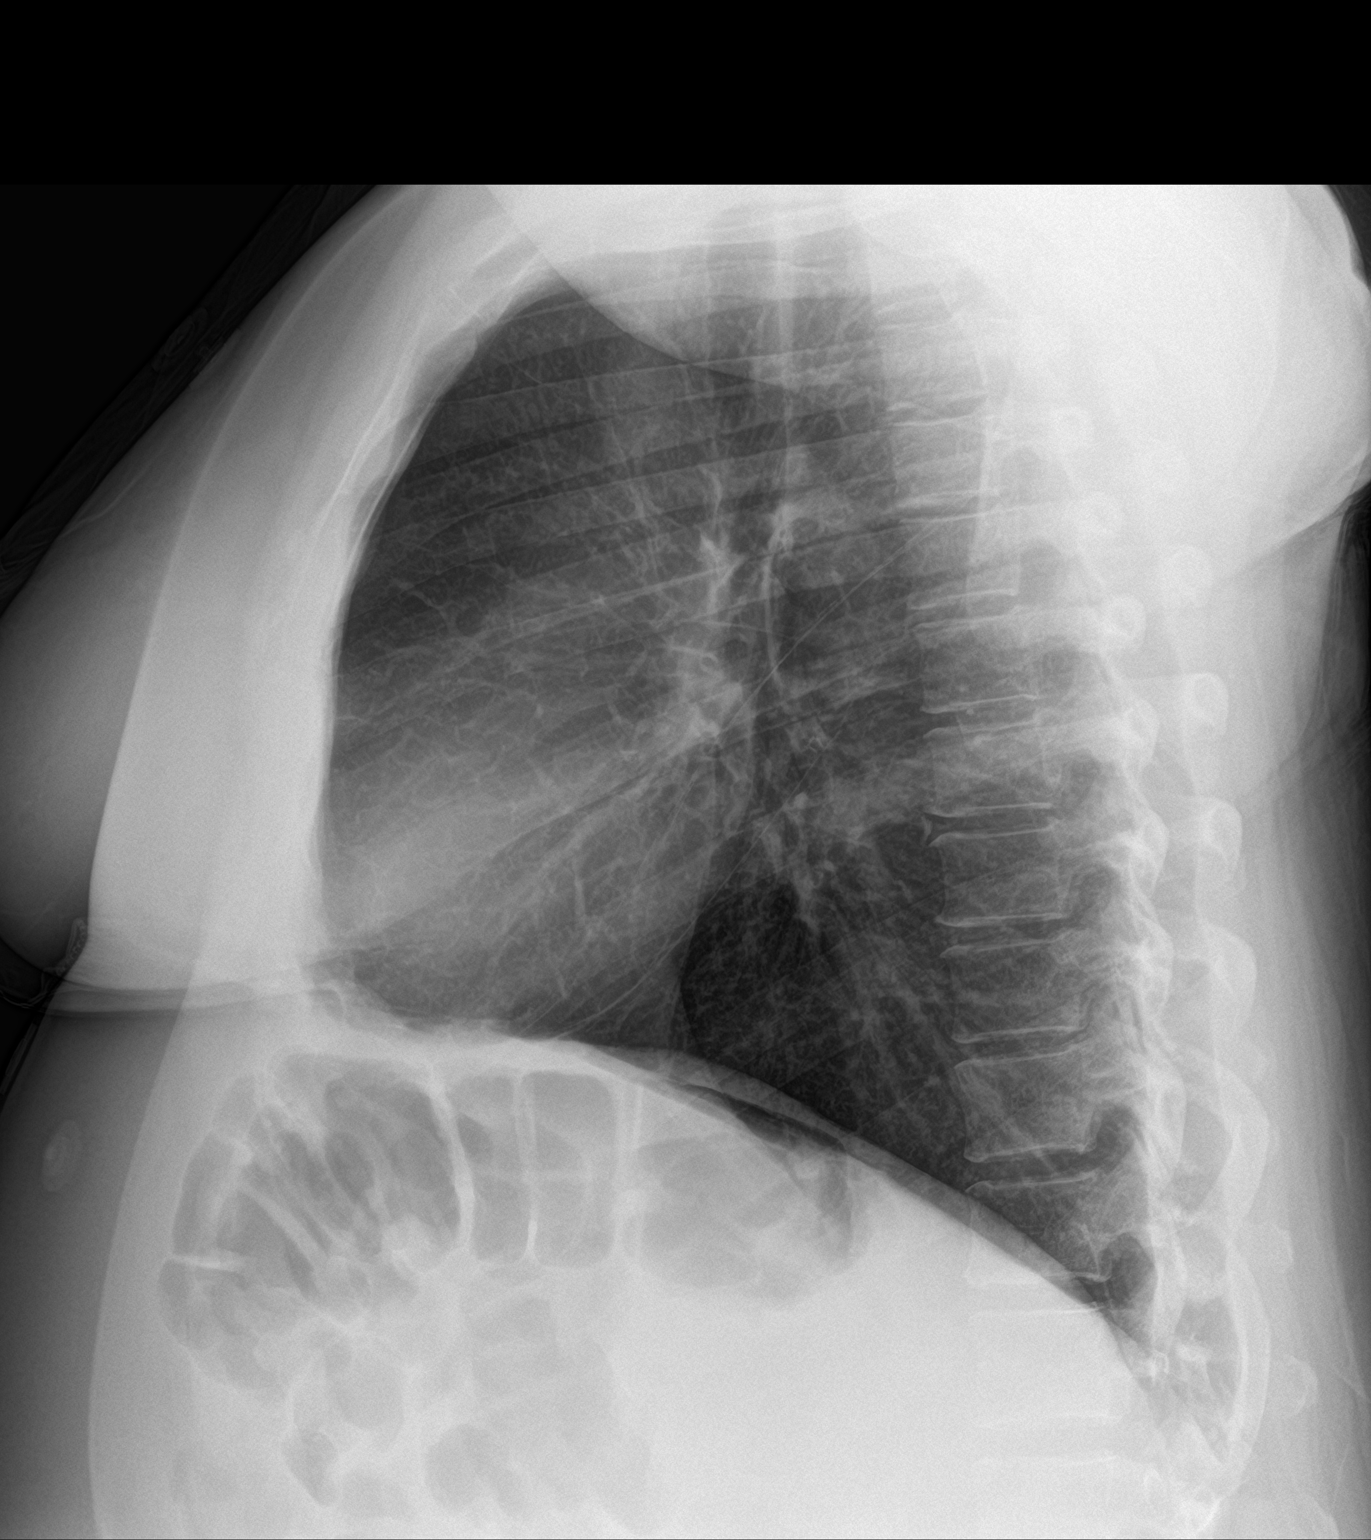
[im 2/2]
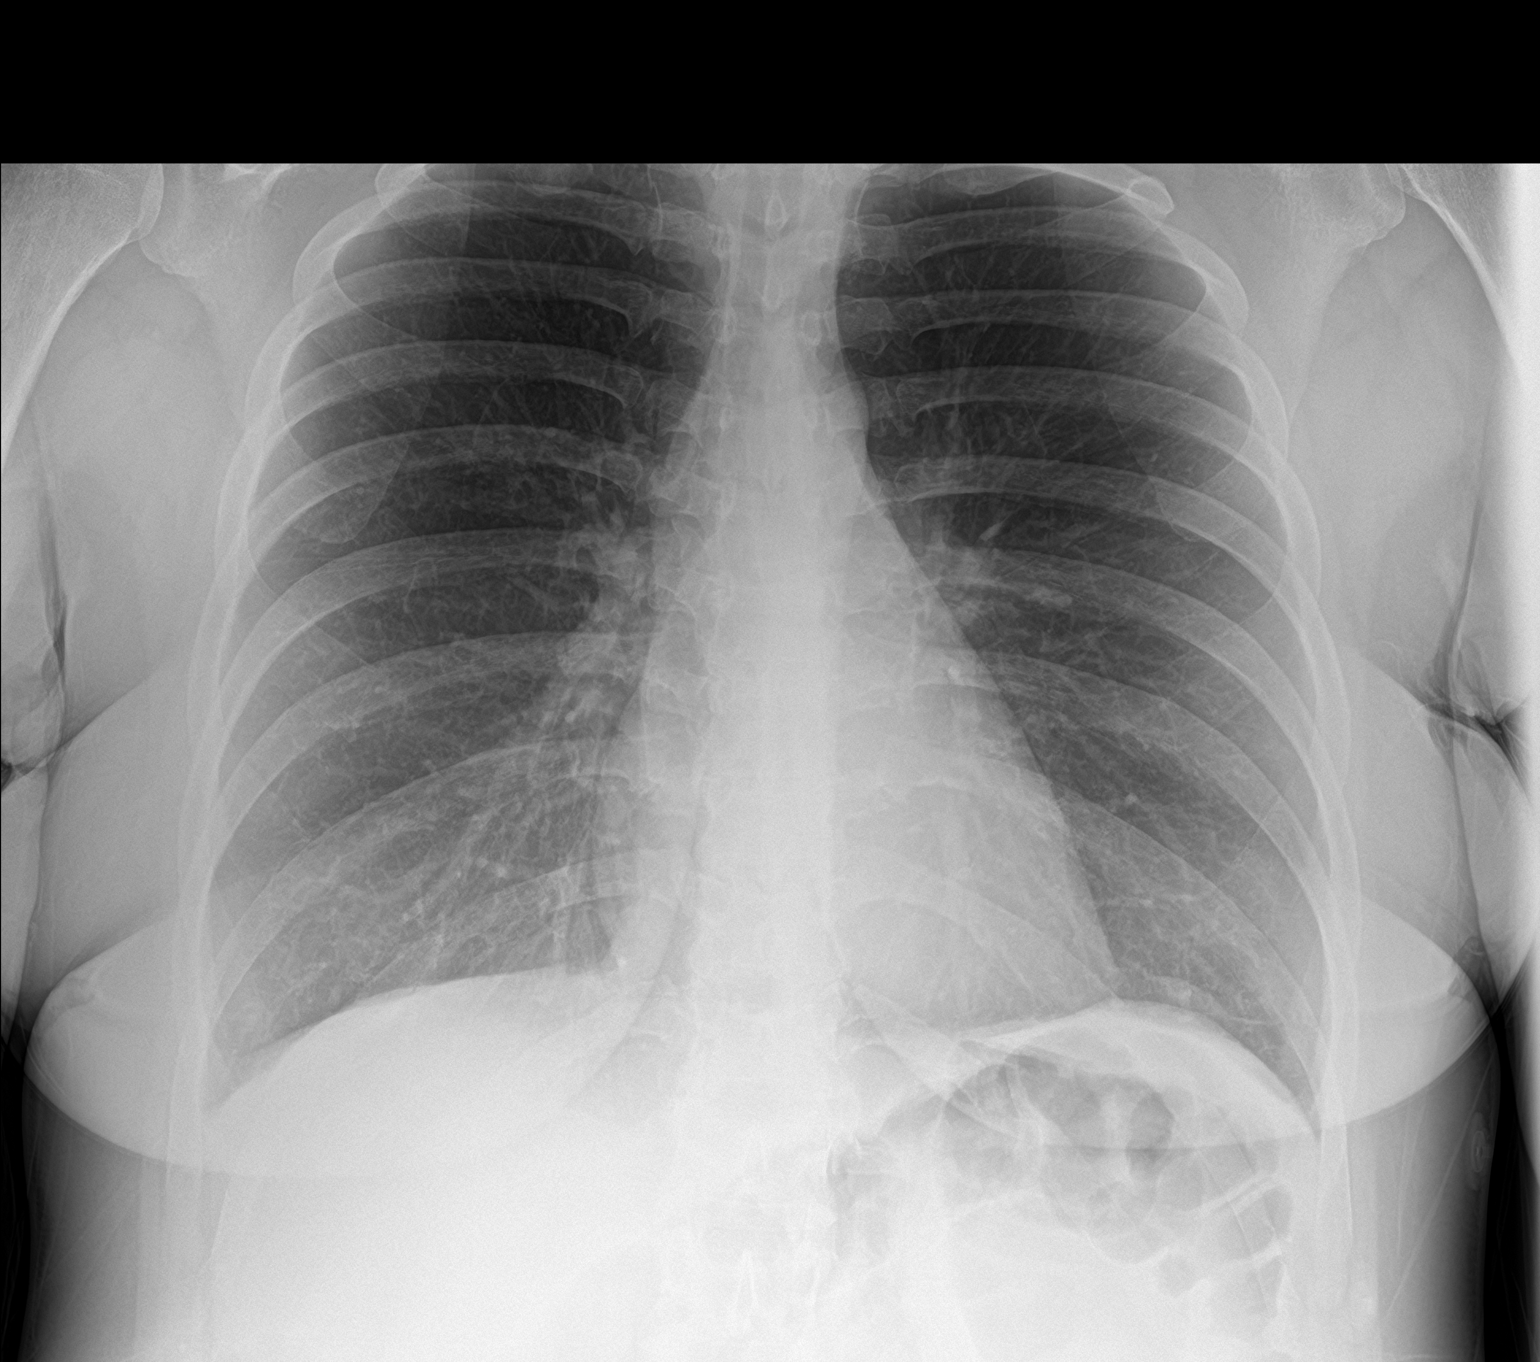

[2 of 2 positions shown; findings below may reference images not displayed]

FINDINGS: The heart size and mediastinal contours are within normal limits.
Both lungs are clear. The visualized skeletal structures are
unremarkable.
IMPRESSION: No active cardiopulmonary disease.

## 2024-03-08 ENCOUNTER — Other Ambulatory Visit: Payer: Self-pay

## 2024-03-08 ENCOUNTER — Encounter (HOSPITAL_BASED_OUTPATIENT_CLINIC_OR_DEPARTMENT_OTHER): Payer: Self-pay | Admitting: Emergency Medicine

## 2024-03-08 ENCOUNTER — Emergency Department (HOSPITAL_BASED_OUTPATIENT_CLINIC_OR_DEPARTMENT_OTHER)
Admission: EM | Admit: 2024-03-08 | Discharge: 2024-03-08 | Disposition: A | Discharge status: Home / Self Care | Payer: Self-pay | Attending: Emergency Medicine | Admitting: Emergency Medicine

## 2024-03-08 DIAGNOSIS — K134 Granuloma and granuloma-like lesions of oral mucosa: Secondary | ICD-10-CM

## 2024-03-08 DIAGNOSIS — K029 Dental caries, unspecified: Secondary | ICD-10-CM

## 2024-03-08 MED ORDER — LIDOCAINE VISCOUS HCL 2 % MT SOLN: 5.0000 mL | Freq: Three times a day (TID) | OROMUCOSAL | 0 refills | Status: AC

## 2024-03-08 MED ORDER — LIDOCAINE VISCOUS HCL 2 % MT SOLN: 5.0000 mL | Freq: Three times a day (TID) | OROMUCOSAL | 0 refills | Status: DC

## 2024-03-08 MED ORDER — NAPROXEN 375 MG PO TABS: 375.0000 mg | ORAL_TABLET | Freq: Two times a day (BID) | ORAL | 0 refills | Status: AC

## 2024-03-08 MED ORDER — PENICILLIN V POTASSIUM 500 MG PO TABS: 500.0000 mg | ORAL_TABLET | Freq: Four times a day (QID) | ORAL | 0 refills | Status: AC

## 2024-03-08 MED ORDER — PENICILLIN V POTASSIUM 250 MG PO TABS
500.0000 mg | ORAL_TABLET | Freq: Once | ORAL | Status: AC
  Administered 2024-03-08 (×2): 500 mg via ORAL
  Filled 2024-03-08: qty 2

## 2024-03-08 NOTE — ED Triage Notes (Signed)
 Sore spot in mouth-front tooth- with some swelling around jaw. Starting this weekend.

## 2024-03-08 NOTE — ED Provider Notes (Signed)
 [place] EMERGENCY DEPARTMENT AT [facility] Provider Note   CSN: [id number] Arrival date & time: 03/08/24  [id number]     Patient presents with: Dental Pain   Sarah Kemp is a 43 y.o. female.   The history is provided by the patient.  Dental Pain Location:  Upper Upper teeth location:  7/RU lateral incisor Quality:  Aching Severity:  Severe Onset quality:  Gradual Timing:  Constant Progression:  Unchanged Chronicity:  New Context: dental caries and poor dentition   Context comment:  Also has a lesion inside gums right upper Previous work-up:  Dental exam Relieved by:  Nothing Worsened by:  Nothing Ineffective treatments:  None tried Associated symptoms: no facial swelling, no fever, no neck pain, no neck swelling and no trismus   Risk factors: no diabetes        Prior to Admission medications   Medication Sig Start Date End Date Taking? Authorizing Provider  magic mouthwash (lidocaine , diphenhydrAMINE , alum & mag hydroxide) suspension Swish and spit 5 mLs 3 (three) times daily. 03/08/24  Yes Jahson Emanuele, MD  naproxen  (NAPROSYN ) 375 MG tablet Take 1 tablet (375 mg total) by mouth 2 (two) times daily with a meal. 03/08/24  Yes Delois Silvester, MD  penicillin  v potassium (VEETID) 500 MG tablet Take 1 tablet (500 mg total) by mouth 4 (four) times daily for 7 days. 03/08/24 03/15/24 Yes Victoria Henshaw, MD  acetaminophen  (TYLENOL ) 500 MG tablet Take 1,000 mg by mouth every 6 (six) hours as needed for mild pain.    [provider]  albuterol  (VENTOLIN  HFA) 108 (90 Base) MCG/ACT inhaler Inhale 1-2 puffs into the lungs every 6 (six) hours as needed for wheezing or shortness of breath. 06/10/20   Maranda Jamee Jacob, MD  benzonatate  (TESSALON ) 200 MG capsule Take 1 capsule (200 mg total) by mouth 2 (two) times daily as needed for cough. 06/10/20   Maranda Jamee Jacob, MD  calcium  carbonate (TUMS - DOSED IN MG ELEMENTAL CALCIUM ) 500 MG chewable tablet Chew 2-3 tablets  by mouth 3 (three) times daily as needed for indigestion or heartburn.    [provider]  HYDROmorphone  (DILAUDID ) 2 MG tablet Take 1 tablet (2 mg total) by mouth every 4 (four) hours as needed for severe pain. 11/07/14   Lenon Oneil BRAVO, MD  hydrOXYzine  (ATARAX /VISTARIL ) 25 MG tablet Take 1 tablet (25 mg total) by mouth every 6 (six) hours. 02/03/19   Lamptey, Aleene KIDD, MD  ondansetron  (ZOFRAN  ODT) 4 MG disintegrating tablet Take 1 tablet (4 mg total) by mouth every 8 (eight) hours as needed for nausea or vomiting. 02/26/21   Ernest Ronal BRAVO, MD  predniSONE  (DELTASONE ) 20 MG tablet Take 1 tablet (20 mg total) by mouth 2 (two) times daily with a meal. 06/10/20   Maranda Jamee Jacob, MD  traMADol  (ULTRAM ) 50 MG tablet Take 1 tablet (50 mg total) by mouth every 6 (six) hours. 09/22/14   Sarrah Browning, MD    Allergies: Vicodin [hydrocodone-acetaminophen ]    Review of Systems  Constitutional:  Negative for fever.  HENT:  Negative for facial swelling.   Musculoskeletal:  Negative for neck pain.  All other systems reviewed and are negative.   Updated Vital Signs BP 126/85 (BP Location: Right Arm)   Pulse 87   Temp (!) 97.5 F (36.4 C)   Resp 19   SpO2 97%   Physical Exam Vitals and nursing note reviewed.  Constitutional:      General: She is not in  acute distress.    Appearance: She is well-developed.  HENT:     Head: Normocephalic and atraumatic.     Comments: No facial no cheek swelling no trismus     Nose: Nose normal.     Mouth/Throat:     Mouth: Mucous membranes are moist.   Eyes:     Pupils: Pupils are equal, round, and reactive to light.  Cardiovascular:     Rate and Rhythm: Normal rate and regular rhythm.     Pulses: Normal pulses.     Heart sounds: Normal heart sounds.  Pulmonary:     Effort: Pulmonary effort is normal. No respiratory distress.     Breath sounds: Normal breath sounds.  Abdominal:     General: Bowel sounds are normal. There is no distension.      Palpations: Abdomen is soft.     Tenderness: There is no abdominal tenderness. There is no guarding or rebound.  Musculoskeletal:        General: Normal range of motion.     Cervical back: Neck supple.  Skin:    General: Skin is dry.     Capillary Refill: Capillary refill takes less than 2 seconds.     Findings: No erythema or rash.  Neurological:     General: No focal deficit present.     Deep Tendon Reflexes: Reflexes normal.  Psychiatric:        Mood and Affect: Mood normal.     (all labs ordered are listed, but only abnormal results are displayed) Labs Reviewed - No data to display  EKG: None  Radiology: No results found.   Procedures   Medications Ordered in the ED  penicillin  v potassium (VEETID) tablet 500 mg (has no administration in time range)                                    Medical Decision Making Dental caries and oral lesion   Amount and/or Complexity of Data Reviewed External Data Reviewed: notes.    Details: Previous notes reviewed   Risk Prescription drug management. Risk Details: Pyogenic granuloma in common location likely reactive from dental caries vs. Trauma to area.  Will treat for dental caries and refer to dentistry.  Stable for discharge with close follow up.        Final diagnoses:  Dental caries  Pyogenic granuloma of oral mucosa   No signs of systemic illness or infection. The patient is nontoxic-appearing on exam and vital signs are within normal limits.  I have reviewed the triage vital signs and the nursing notes. Pertinent labs & imaging results that were available during my care of the patient were reviewed by me and considered in my medical decision making (see chart for details). After history, exam, and medical workup I feel the patient has been appropriately medically screened and is safe for discharge home. Pertinent diagnoses were discussed with the patient. Patient was given return precautions.  ED Discharge Orders           Ordered    penicillin  v potassium (VEETID) 500 MG tablet  4 times daily        03/08/24 0132    magic mouthwash (lidocaine , diphenhydrAMINE , alum & mag hydroxide) suspension  3 times daily        03/08/24 0132    naproxen  (NAPROSYN ) 375 MG tablet  2 times daily with meals  03/08/24 0132               Ericah Scotto, MD 03/08/24 0139

## 2024-03-08 NOTE — ED Notes (Signed)
 Discharge instructions reviewed.   Newly prescribed medications discussed. Pharmacy verified.   Opportunity for questions and concerns provided.   Alert, oriented and ambulatory.   Displays no signs of distress.
